# Patient Record
Sex: Male | Born: 1955
Health system: Southern US, Community
[De-identification: ages and names within clinical notes are randomized; demographics above are authoritative.]

## PROBLEM LIST (undated history)

## (undated) DIAGNOSIS — Z973 Presence of spectacles and contact lenses: Secondary | ICD-10-CM

## (undated) DIAGNOSIS — Z86010 Personal history of colonic polyps: Secondary | ICD-10-CM

## (undated) DIAGNOSIS — Z85038 Personal history of other malignant neoplasm of large intestine: Secondary | ICD-10-CM

## (undated) DIAGNOSIS — Z800 Family history of malignant neoplasm of digestive organs: Secondary | ICD-10-CM

## (undated) DIAGNOSIS — Z1509 Genetic susceptibility to other malignant neoplasm: Secondary | ICD-10-CM

## (undated) DIAGNOSIS — C189 Malignant neoplasm of colon, unspecified: Secondary | ICD-10-CM

## (undated) DIAGNOSIS — M1990 Unspecified osteoarthritis, unspecified site: Secondary | ICD-10-CM

## (undated) DIAGNOSIS — Z8041 Family history of malignant neoplasm of ovary: Secondary | ICD-10-CM

## (undated) DIAGNOSIS — R413 Other amnesia: Secondary | ICD-10-CM

## (undated) HISTORY — DX: Other amnesia: R41.3

## (undated) HISTORY — PX: COLONOSCOPY: SHX174

## (undated) HISTORY — DX: Personal history of other malignant neoplasm of large intestine: Z85.038

## (undated) HISTORY — PX: APPENDECTOMY: SHX54

## (undated) HISTORY — DX: Family history of malignant neoplasm of digestive organs: Z80.0

## (undated) HISTORY — DX: Malignant neoplasm of colon, unspecified: C18.9

## (undated) HISTORY — DX: Personal history of colonic polyps: Z86.010

## (undated) HISTORY — PX: TONSILLECTOMY: SUR1361

## (undated) HISTORY — DX: Genetic susceptibility to other malignant neoplasm: Z15.09

## (undated) HISTORY — PX: INGUINAL HERNIA REPAIR: SHX194

## (undated) HISTORY — DX: Family history of malignant neoplasm of ovary: Z80.41

---

## 1976-05-14 HISTORY — PX: WRIST GANGLION EXCISION: SUR520

## 1983-05-15 HISTORY — PX: COLECTOMY: SHX59

## 1998-01-06 ENCOUNTER — Encounter: Admission: RE | Admit: 1998-01-06 | Discharge: 1998-01-06 | Payer: Self-pay | Admitting: Family Medicine

## 1998-01-13 ENCOUNTER — Encounter: Admission: RE | Admit: 1998-01-13 | Discharge: 1998-01-13 | Payer: Self-pay | Admitting: Family Medicine

## 2003-12-01 ENCOUNTER — Emergency Department (HOSPITAL_COMMUNITY): Admission: EM | Admit: 2003-12-01 | Discharge: 2003-12-01 | Payer: Self-pay | Admitting: Emergency Medicine

## 2005-12-20 ENCOUNTER — Encounter: Admission: RE | Admit: 2005-12-20 | Discharge: 2006-01-11 | Payer: Self-pay | Admitting: Internal Medicine

## 2009-05-14 HISTORY — PX: SHOULDER ARTHROSCOPY: SHX128

## 2010-02-09 ENCOUNTER — Ambulatory Visit (HOSPITAL_BASED_OUTPATIENT_CLINIC_OR_DEPARTMENT_OTHER): Admission: RE | Admit: 2010-02-09 | Discharge: 2010-02-09 | Payer: Self-pay | Admitting: Orthopedic Surgery

## 2012-08-19 ENCOUNTER — Other Ambulatory Visit: Payer: Self-pay | Admitting: Orthopedic Surgery

## 2012-08-20 ENCOUNTER — Encounter (HOSPITAL_BASED_OUTPATIENT_CLINIC_OR_DEPARTMENT_OTHER): Payer: Self-pay | Admitting: *Deleted

## 2012-08-20 NOTE — Progress Notes (Signed)
Pt here for rt shoulder 2011-did well

## 2012-08-20 NOTE — H&P (Signed)
  Jose Fisher is an 56 y.o. male.   Chief Complaint: c/o chronic and progressive left shoulder pain HPI:     Jose Fisher is a well known former patient who presents for preoperative counseling anticipating reconstruction of his left shoulder rotator cuff tear.  During my absence, Jose Fisher saw Dr. Mina Marble for evaluation of left shoulder pain and had a MRI documenting a retracted (3.6 cm.) supraspinatus rotator cuff tear as well as tendinopathy of the infraspinatus and unfavorable AC and acromial anatomy.  Jose Fisher has had an outstanding result following reconstruction of his right rotator cuff on 02/09/2010.  He now presents requesting similar surgery on the left side.     Past Medical History  Diagnosis Date  . Arthritis   . Wears glasses     Past Surgical History  Procedure Laterality Date  . Tonsillectomy    . Appendectomy    . Colectomy  1985    hemi  . Inguinal hernia repair      bilat  . Wrist ganglion excision  1978    lt  . Colonoscopy    . Shoulder arthroscopy  2011    RCR-DSC-right stayed RCC    History reviewed. No pertinent family history. Social History:  reports that he has never smoked. He does not have any smokeless tobacco history on file. He reports that  drinks alcohol. He reports that he does not use illicit drugs.  Allergies: No Known Allergies  No prescriptions prior to admission    No results found for this or any previous visit (from the past 48 hour(s)).  No results found.   Pertinent items are noted in HPI.  Height 5\' 4"  (1.626 m), weight 72.122 kg (159 lb).  General appearance: alert Head: Normocephalic, without obvious abnormality Neck: supple, symmetrical, trachea midline Resp: clear to auscultation bilaterally Cardio: regular rate and rhythm GI: normal findings: bowel sounds normal Extremities:  His gait and stance are unremarkable. He has full range of motion of his right shoulder, the left shoulder has near full range of  motion, but has pain at the limits of elevation and internal rotation behind his back.  He has weakness of abduction, external rotation.  He has intact pulse and capillary refill.     RADIOGRAPHS:   We reviewed his MRI which demonstrates unfavorable AC and acromial anatomy as well as the aforementioned retracted rotator cuff tear.  We will carefully inspect his subscapularis.   Pulses: 2+ and symmetric Skin: normal Neurologic: Grossly normal    Assessment/Plan Impression::   Left shoulder rotator cuff tear.  Plan:: To the OR for left SA with SAD/DCR with [facility] repair   The surgery, potential risks and benefits; aftercare and his work restrictions were reviewed in detail.  He will be treated on an outpatient basis with a 23 hour observation.  We discussed his perioperative pain management in detail.    DASNOIT,Diara Chaudhari J 08/20/2012, 9:05 PM   H&P documentation: 08/21/2012  -History and Physical Reviewed  -Patient has been re-examined  -No change in the plan of care  Wyn Forster, MD

## 2012-08-21 ENCOUNTER — Encounter (HOSPITAL_BASED_OUTPATIENT_CLINIC_OR_DEPARTMENT_OTHER)
Admission: RE | Disposition: A | Discharge status: Home / Self Care | Payer: Self-pay | Source: Ambulatory Visit | Attending: Orthopedic Surgery

## 2012-08-21 ENCOUNTER — Ambulatory Visit (HOSPITAL_BASED_OUTPATIENT_CLINIC_OR_DEPARTMENT_OTHER): Admitting: Certified Registered"

## 2012-08-21 ENCOUNTER — Ambulatory Visit (HOSPITAL_BASED_OUTPATIENT_CLINIC_OR_DEPARTMENT_OTHER)
Admission: RE | Admit: 2012-08-21 | Discharge: 2012-08-22 | Disposition: A | Discharge status: Home / Self Care | Source: Ambulatory Visit | Attending: Orthopedic Surgery | Admitting: Orthopedic Surgery

## 2012-08-21 ENCOUNTER — Encounter (HOSPITAL_BASED_OUTPATIENT_CLINIC_OR_DEPARTMENT_OTHER): Payer: Self-pay | Admitting: Orthopedic Surgery

## 2012-08-21 ENCOUNTER — Encounter (HOSPITAL_BASED_OUTPATIENT_CLINIC_OR_DEPARTMENT_OTHER): Payer: Self-pay | Admitting: Certified Registered"

## 2012-08-21 DIAGNOSIS — M75110 Incomplete rotator cuff tear or rupture of unspecified shoulder, not specified as traumatic: Secondary | ICD-10-CM | POA: Insufficient documentation

## 2012-08-21 DIAGNOSIS — M19019 Primary osteoarthritis, unspecified shoulder: Secondary | ICD-10-CM | POA: Insufficient documentation

## 2012-08-21 DIAGNOSIS — M9420 Chondromalacia, unspecified site: Secondary | ICD-10-CM | POA: Insufficient documentation

## 2012-08-21 HISTORY — DX: Presence of spectacles and contact lenses: Z97.3

## 2012-08-21 HISTORY — DX: Unspecified osteoarthritis, unspecified site: M19.90

## 2012-08-21 HISTORY — PX: SHOULDER ARTHROSCOPY WITH ROTATOR CUFF REPAIR AND SUBACROMIAL DECOMPRESSION: SHX5686

## 2012-08-21 SURGERY — SHOULDER ARTHROSCOPY WITH ROTATOR CUFF REPAIR AND SUBACROMIAL DECOMPRESSION
Anesthesia: Regional | Site: Shoulder | Laterality: Left | Wound class: Clean

## 2012-08-21 MED ORDER — HYDROMORPHONE HCL PF 1 MG/ML IJ SOLN: 0.2500 mg | INTRAMUSCULAR | Status: DC | PRN

## 2012-08-21 MED ORDER — LIDOCAINE HCL (CARDIAC) 10 MG/ML IV SOLN
INTRAVENOUS | Status: DC | PRN
  Administered 2012-08-21: 80 mg via INTRAVENOUS

## 2012-08-21 MED ORDER — ONDANSETRON HCL 4 MG/2ML IJ SOLN: 4.0000 mg | Freq: Four times a day (QID) | INTRAMUSCULAR | Status: DC | PRN

## 2012-08-21 MED ORDER — METOCLOPRAMIDE HCL 5 MG PO TABS: 5.0000 mg | ORAL_TABLET | Freq: Three times a day (TID) | ORAL | Status: DC | PRN

## 2012-08-21 MED ORDER — OXYCODONE HCL 5 MG PO TABS: 5.0000 mg | ORAL_TABLET | Freq: Once | ORAL | Status: DC | PRN

## 2012-08-21 MED ORDER — CEFAZOLIN SODIUM-DEXTROSE 2-3 GM-% IV SOLR: 2.0000 g | INTRAVENOUS | Status: DC

## 2012-08-21 MED ORDER — HYDROMORPHONE HCL PF 1 MG/ML IJ SOLN
0.5000 mg | INTRAMUSCULAR | Status: DC | PRN
  Administered 2012-08-22: 1 mg via INTRAVENOUS

## 2012-08-21 MED ORDER — LACTATED RINGERS IV SOLN
INTRAVENOUS | Status: DC
  Administered 2012-08-21 (×2): via INTRAVENOUS

## 2012-08-21 MED ORDER — CHLORHEXIDINE GLUCONATE 4 % EX LIQD
60.0000 mL | Freq: Once | CUTANEOUS | Status: AC
  Administered 2012-08-21: 4 via TOPICAL

## 2012-08-21 MED ORDER — ONDANSETRON HCL 4 MG PO TABS: 4.0000 mg | ORAL_TABLET | Freq: Four times a day (QID) | ORAL | Status: DC | PRN

## 2012-08-21 MED ORDER — PROMETHAZINE HCL 25 MG/ML IJ SOLN: 6.2500 mg | INTRAMUSCULAR | Status: DC | PRN

## 2012-08-21 MED ORDER — EPHEDRINE SULFATE 50 MG/ML IJ SOLN
INTRAMUSCULAR | Status: DC | PRN
  Administered 2012-08-21: 10 mg via INTRAVENOUS

## 2012-08-21 MED ORDER — ROPIVACAINE HCL 5 MG/ML IJ SOLN
INTRAMUSCULAR | Status: DC | PRN
  Administered 2012-08-21: 25 mL

## 2012-08-21 MED ORDER — SODIUM CHLORIDE 0.9 % IR SOLN
Status: DC | PRN
  Administered 2012-08-21: 3000 mL

## 2012-08-21 MED ORDER — METHOCARBAMOL 100 MG/ML IJ SOLN: 500.0000 mg | Freq: Four times a day (QID) | INTRAVENOUS | Status: DC | PRN

## 2012-08-21 MED ORDER — METOCLOPRAMIDE HCL 5 MG/ML IJ SOLN: 5.0000 mg | Freq: Three times a day (TID) | INTRAMUSCULAR | Status: DC | PRN

## 2012-08-21 MED ORDER — DEXAMETHASONE SODIUM PHOSPHATE 4 MG/ML IJ SOLN
INTRAMUSCULAR | Status: DC | PRN
  Administered 2012-08-21: 4 mg via INTRAVENOUS

## 2012-08-21 MED ORDER — METHOCARBAMOL 500 MG PO TABS
500.0000 mg | ORAL_TABLET | Freq: Four times a day (QID) | ORAL | Status: DC | PRN
  Administered 2012-08-21 – 2012-08-22 (×3): 500 mg via ORAL

## 2012-08-21 MED ORDER — PROPOFOL 10 MG/ML IV BOLUS
INTRAVENOUS | Status: DC | PRN
  Administered 2012-08-21: 180 mg via INTRAVENOUS

## 2012-08-21 MED ORDER — CEFAZOLIN SODIUM 1-5 GM-% IV SOLN
1.0000 g | Freq: Four times a day (QID) | INTRAVENOUS | Status: AC
  Administered 2012-08-21 – 2012-08-22 (×2): 1 g via INTRAVENOUS

## 2012-08-21 MED ORDER — GLYCOPYRROLATE 0.2 MG/ML IJ SOLN
INTRAMUSCULAR | Status: DC | PRN
  Administered 2012-08-21: 0.2 mg via INTRAVENOUS

## 2012-08-21 MED ORDER — MEPERIDINE HCL 25 MG/ML IJ SOLN: 6.2500 mg | INTRAMUSCULAR | Status: DC | PRN

## 2012-08-21 MED ORDER — HYDROMORPHONE HCL 2 MG PO TABS
2.0000 mg | ORAL_TABLET | ORAL | Status: DC | PRN
  Administered 2012-08-22 (×2): 2 mg via ORAL

## 2012-08-21 MED ORDER — CEFAZOLIN SODIUM-DEXTROSE 2-3 GM-% IV SOLR
2.0000 g | Freq: Once | INTRAVENOUS | Status: AC
  Administered 2012-08-21: 2 g via INTRAVENOUS

## 2012-08-21 MED ORDER — CEFAZOLIN SODIUM 1-5 GM-% IV SOLN: 1.0000 g | Freq: Four times a day (QID) | INTRAVENOUS | Status: DC

## 2012-08-21 MED ORDER — IBUPROFEN 600 MG PO TABS: 600.0000 mg | ORAL_TABLET | Freq: Four times a day (QID) | ORAL | Status: DC | PRN

## 2012-08-21 MED ORDER — FENTANYL CITRATE 0.05 MG/ML IJ SOLN
50.0000 ug | INTRAMUSCULAR | Status: DC | PRN
  Administered 2012-08-21: 50 ug via INTRAVENOUS

## 2012-08-21 MED ORDER — CEPHALEXIN 500 MG PO CAPS: 500.0000 mg | ORAL_CAPSULE | Freq: Three times a day (TID) | ORAL | Status: DC

## 2012-08-21 MED ORDER — ROPIVACAINE HCL 5 MG/ML IJ SOLN
INTRAMUSCULAR | Status: DC | PRN
  Administered 2012-08-21: 3 mL

## 2012-08-21 MED ORDER — HYDROMORPHONE HCL 2 MG PO TABS: 2.0000 mg | ORAL_TABLET | ORAL | Status: DC | PRN

## 2012-08-21 MED ORDER — DEXTROSE-NACL 5-0.45 % IV SOLN
INTRAVENOUS | Status: DC
  Administered 2012-08-21: 17:00:00 via INTRAVENOUS

## 2012-08-21 MED ORDER — OXYCODONE HCL 5 MG/5ML PO SOLN: 5.0000 mg | Freq: Once | ORAL | Status: DC | PRN

## 2012-08-21 MED ORDER — MIDAZOLAM HCL 2 MG/2ML IJ SOLN
1.0000 mg | INTRAMUSCULAR | Status: DC | PRN
  Administered 2012-08-21: 1 mg via INTRAVENOUS

## 2012-08-21 SURGICAL SUPPLY — 83 items
ANCH SUT PUSHLCK 24X4.5 STRL (Orthopedic Implant) ×1 IMPLANT
ANCH SUT SWLK 19.1 CLS EYLT TL (Anchor) ×1 IMPLANT
ANCH SUT SWLK 19.1X4.75 (Anchor) ×2 IMPLANT
ANCHOR SUT BIO SW 4.75 W/FIB (Anchor) ×1 IMPLANT
ANCHOR SUT BIO SW 4.75X19.1 (Anchor) ×2 IMPLANT
BANDAGE ADHESIVE 1X3 (GAUZE/BANDAGES/DRESSINGS) IMPLANT
BLADE AVERAGE 25X9 (BLADE) IMPLANT
BLADE CUTTER MENIS 5.5 (BLADE) ×1 IMPLANT
BLADE SURG 15 STRL LF DISP TIS (BLADE) ×2 IMPLANT
BLADE SURG 15 STRL SS (BLADE) ×4
BUR EGG 3PK/BX (BURR) ×1 IMPLANT
BUR OVAL 6.0 (BURR) ×2 IMPLANT
CANISTER OMNI JUG 16 LITER (MISCELLANEOUS) ×2 IMPLANT
CANISTER SUCTION 2500CC (MISCELLANEOUS) IMPLANT
CANNULA TWIST IN 8.25X7CM (CANNULA) IMPLANT
CLEANER CAUTERY TIP 5X5 PAD (MISCELLANEOUS) IMPLANT
CLOTH BEACON ORANGE TIMEOUT ST (SAFETY) ×2 IMPLANT
CUTTER MENISCUS  4.2MM (BLADE) ×1
CUTTER MENISCUS 4.2MM (BLADE) ×1 IMPLANT
DECANTER SPIKE VIAL GLASS SM (MISCELLANEOUS) IMPLANT
DRAPE INCISE IOBAN 66X45 STRL (DRAPES) ×2 IMPLANT
DRAPE STERI 35X30 U-POUCH (DRAPES) ×2 IMPLANT
DRAPE SURG 17X23 STRL (DRAPES) ×2 IMPLANT
DRAPE U-SHAPE 47X51 STRL (DRAPES) ×2 IMPLANT
DRAPE U-SHAPE 76X120 STRL (DRAPES) ×4 IMPLANT
DRSG PAD ABDOMINAL 8X10 ST (GAUZE/BANDAGES/DRESSINGS) ×2 IMPLANT
DURAPREP 26ML APPLICATOR (WOUND CARE) ×2 IMPLANT
ELECT REM PT RETURN 9FT ADLT (ELECTROSURGICAL) ×2
ELECTRODE REM PT RTRN 9FT ADLT (ELECTROSURGICAL) IMPLANT
GLOVE BIO SURGEON STRL SZ 6.5 (GLOVE) ×1 IMPLANT
GLOVE BIOGEL M STRL SZ7.5 (GLOVE) ×2 IMPLANT
GLOVE BIOGEL PI IND STRL 7.0 (GLOVE) IMPLANT
GLOVE BIOGEL PI IND STRL 8 (GLOVE) ×2 IMPLANT
GLOVE BIOGEL PI INDICATOR 7.0 (GLOVE) ×1
GLOVE BIOGEL PI INDICATOR 8 (GLOVE) ×2
GLOVE ORTHO TXT STRL SZ7.5 (GLOVE) ×2 IMPLANT
GOWN BRE IMP PREV XXLGXLNG (GOWN DISPOSABLE) ×4 IMPLANT
GOWN STRL REIN 2XL XLG LVL4 (GOWN DISPOSABLE) ×2 IMPLANT
NDL SCORPION (NEEDLE) ×1 IMPLANT
NDL SUT 6 .5 CRC .975X.05 MAYO (NEEDLE) IMPLANT
NEEDLE MAYO TAPER (NEEDLE) ×2
NEEDLE MINI RC 24MM (NEEDLE) IMPLANT
NEEDLE SCORPION (NEEDLE) ×2 IMPLANT
PACK ARTHROSCOPY DSU (CUSTOM PROCEDURE TRAY) ×2 IMPLANT
PACK BASIN DAY SURGERY FS (CUSTOM PROCEDURE TRAY) ×2 IMPLANT
PAD CLEANER CAUTERY TIP 5X5 (MISCELLANEOUS)
PASSER SUT SWANSON 36MM LOOP (INSTRUMENTS) IMPLANT
PENCIL BUTTON HOLSTER BLD 10FT (ELECTRODE) ×1 IMPLANT
PUSHLOCK PEEK 4.5X24 (Orthopedic Implant) ×1 IMPLANT
SLEEVE SCD COMPRESS KNEE MED (MISCELLANEOUS) ×2 IMPLANT
SLING ARM FOAM STRAP LRG (SOFTGOODS) IMPLANT
SLING ARM FOAM STRAP MED (SOFTGOODS) ×1 IMPLANT
SPONGE GAUZE 4X4 12PLY (GAUZE/BANDAGES/DRESSINGS) ×2 IMPLANT
SPONGE LAP 4X18 X RAY DECT (DISPOSABLE) ×2 IMPLANT
STRIP CLOSURE SKIN 1/2X4 (GAUZE/BANDAGES/DRESSINGS) ×1 IMPLANT
SUCTION FRAZIER TIP 10 FR DISP (SUCTIONS) IMPLANT
SUT ETHIBOND 2 OS 4 DA (SUTURE) IMPLANT
SUT ETHILON 4 0 PS 2 18 (SUTURE) IMPLANT
SUT FIBERWIRE #2 38 T-5 BLUE (SUTURE) ×2
SUT FIBERWIRE 3-0 18 TAPR NDL (SUTURE)
SUT PROLENE 1 CT (SUTURE) IMPLANT
SUT PROLENE 3 0 PS 2 (SUTURE) ×2 IMPLANT
SUT TIGER TAPE 7 IN WHITE (SUTURE) ×1 IMPLANT
SUT VIC AB 0 CT1 27 (SUTURE) ×2
SUT VIC AB 0 CT1 27XBRD ANBCTR (SUTURE) IMPLANT
SUT VIC AB 0 SH 27 (SUTURE) IMPLANT
SUT VIC AB 2-0 SH 27 (SUTURE) ×2
SUT VIC AB 2-0 SH 27XBRD (SUTURE) IMPLANT
SUT VIC AB 3-0 SH 27 (SUTURE)
SUT VIC AB 3-0 SH 27X BRD (SUTURE) IMPLANT
SUT VIC AB 3-0 X1 27 (SUTURE) IMPLANT
SUTURE FIBERWR #2 38 T-5 BLUE (SUTURE) IMPLANT
SUTURE FIBERWR 3-0 18 TAPR NDL (SUTURE) IMPLANT
SYR 3ML 23GX1 SAFETY (SYRINGE) IMPLANT
SYR BULB 3OZ (MISCELLANEOUS) ×1 IMPLANT
TAPE FIBER 2MM 7IN #2 BLUE (SUTURE) ×1 IMPLANT
TAPE PAPER 3X10 WHT MICROPORE (GAUZE/BANDAGES/DRESSINGS) ×2 IMPLANT
TOWEL OR 17X24 6PK STRL BLUE (TOWEL DISPOSABLE) ×2 IMPLANT
TUBE CONNECTING 20X1/4 (TUBING) ×2 IMPLANT
TUBING ARTHROSCOPY IRRIG 16FT (MISCELLANEOUS) ×2 IMPLANT
WAND STAR VAC 90 (SURGICAL WAND) ×2 IMPLANT
WATER STERILE IRR 1000ML POUR (IV SOLUTION) ×2 IMPLANT
YANKAUER SUCT BULB TIP NO VENT (SUCTIONS) IMPLANT

## 2012-08-21 NOTE — Anesthesia Preprocedure Evaluation (Signed)
Anesthesia Evaluation  Patient identified by MRN, date of birth, ID band Patient awake    Reviewed: Allergy & Precautions, H&P , NPO status , Patient's Chart, lab work & pertinent test results  History of Anesthesia Complications Negative for: history of anesthetic complications  Airway Mallampati: I  Neck ROM: full    Dental no notable dental hx. (+) Teeth Intact   Pulmonary neg pulmonary ROS,  breath sounds clear to auscultation  Pulmonary exam normal       Cardiovascular negative cardio ROS  IRhythm:regular Rate:Normal     Neuro/Psych negative neurological ROS  negative psych ROS   GI/Hepatic negative GI ROS, Neg liver ROS,   Endo/Other  negative endocrine ROS  Renal/GU negative Renal ROS  negative genitourinary   Musculoskeletal   Abdominal   Peds  Hematology negative hematology ROS (+)   Anesthesia Other Findings   Reproductive/Obstetrics negative OB ROS                           Anesthesia Physical Anesthesia Plan  ASA: I  Anesthesia Plan: General and General ETT   Post-op Pain Management: MAC Combined w/ Regional for Post-op pain   Induction:   Airway Management Planned:   Additional Equipment:   Intra-op Plan:   Post-operative Plan:   Informed Consent: I have reviewed the patients History and Physical, chart, labs and discussed the procedure including the risks, benefits and alternatives for the proposed anesthesia with the patient or authorized representative who has indicated his/her understanding and acceptance.     Plan Discussed with: CRNA and Surgeon  Anesthesia Plan Comments:         Anesthesia Quick Evaluation

## 2012-08-21 NOTE — Transfer of Care (Signed)
Immediate Anesthesia Transfer of Care Note  Patient: Jose Fisher  Procedure(s) Performed: Procedure(s): LEFT SHOULDER ARTHROSCOPY SUBACROMIAL DECOMPRESSION, DISTAL CLAVICLE RESECTION AND REPAIR ROTATOR CUFF (Left)  Patient Location: PACU  Anesthesia Type:GA combined with regional for post-op pain  Level of Consciousness: awake, sedated and patient cooperative  Airway & Oxygen Therapy: Patient Spontanous Breathing and Patient connected to face mask oxygen  Post-op Assessment: Report given to PACU RN and Post -op Vital signs reviewed and stable  Post vital signs: Reviewed and stable  Complications: No apparent anesthesia complications

## 2012-08-21 NOTE — Anesthesia Procedure Notes (Addendum)
Anesthesia Regional Block:  Interscalene brachial plexus block  Pre-Anesthetic Checklist: ,, timeout performed, Correct Patient, Correct Site, Correct Laterality, Correct Procedure, Correct Position, site marked, Risks and benefits discussed, pre-op evaluation,  At surgeon's request and post-op pain management  Laterality: Upper  Prep: chloraprep       Needles:  Injection technique: Single-shot  Needle Type: Echogenic Needle      Needle Gauge: 22 and 22 G  Needle insertion depth: 3 cm   Additional Needles:  Procedures: ultrasound guided (picture in chart) and nerve stimulator Interscalene brachial plexus block  Nerve Stimulator or Paresthesia:  Response: Twitch elicited, 0.5 mA, 0.3 ms,   Additional Responses:   Narrative:  Start time: 08/21/2012 11:45 AM End time: 08/21/2012 12:03 PM Injection made incrementally with aspirations every 5 mL.  Performed by: Personally  Anesthesiologist: Alma Friendly, MD  Additional Notes: Block assessed prior to start of surgery  Interscalene brachial plexus block Procedure Name: Intubation Date/Time: 08/21/2012 1:43 PM Performed by: Gar Gibbon Pre-anesthesia Checklist: Patient identified, Emergency Drugs available, Suction available and Patient being monitored Patient Re-evaluated:Patient Re-evaluated prior to inductionOxygen Delivery Method: Circle System Utilized Preoxygenation: Pre-oxygenation with 100% oxygen Intubation Type: IV induction Ventilation: Mask ventilation without difficulty Laryngoscope Size: Miller and 3 Grade View: Grade II Tube type: Oral Tube size: 8.0 mm Number of attempts: 1 Airway Equipment and Method: stylet and oral airway Placement Confirmation: ETT inserted through vocal cords under direct vision,  positive ETCO2 and breath sounds checked- equal and bilateral Secured at: 22 cm Tube secured with: Tape Dental Injury: Teeth and Oropharynx as per pre-operative assessment

## 2012-08-21 NOTE — Progress Notes (Signed)
Assisted Dr. Massagee with left, ultrasound guided, interscalene  block. Side rails up, monitors on throughout procedure. See vital signs in flow sheet. Tolerated Procedure well. 

## 2012-08-21 NOTE — Brief Op Note (Signed)
08/21/2012  3:41 PM  PATIENT:  Jose Fisher  57 y.o. male  PRE-OPERATIVE DIAGNOSIS:  LEFT SHOULDER ROTATOR CUFF TEAR A-C DJD IMPINGEMENT  POST-OPERATIVE DIAGNOSIS:  Left Shoulder rotator cuff Tear  PROCEDURE:  Procedure(s): LEFT SHOULDER ARTHROSCOPY SUBACROMIAL DECOMPRESSION, DISTAL CLAVICLE RESECTION AND REPAIR ROTATOR CUFF (Left)  SURGEON:  Surgeon(s) and Role:    * Wyn Forster., MD - Primary  PHYSICIAN ASSISTANT:   ASSISTANTS:Saman Giddens Dasnoit,P.A-C   ANESTHESIA:   general  EBL:  Total I/O In: 1000 [I.V.:1000] Out: -   BLOOD ADMINISTERED:none  DRAINS: none   LOCAL MEDICATIONS USED: Ropivacaine suprascapular nerve block  SPECIMEN:  No Specimen  DISPOSITION OF SPECIMEN:  N/A  COUNTS:  YES  TOURNIQUET:  * No tourniquets in log *  DICTATION: .Other Dictation: Dictation Number [id number]  PLAN OF CARE: Admit for overnight observation  PATIENT DISPOSITION:  PACU - hemodynamically stable.   Delay start of Pharmacological VTE agent (>24hrs) due to surgical blood loss or risk of bleeding: not applicable

## 2012-08-21 NOTE — Anesthesia Postprocedure Evaluation (Signed)
  Anesthesia Post-op Note  Patient: Jose Fisher  Procedure(s) Performed: Procedure(s): LEFT SHOULDER ARTHROSCOPY SUBACROMIAL DECOMPRESSION, DISTAL CLAVICLE RESECTION AND REPAIR ROTATOR CUFF (Left)  Patient Location: PACU  Anesthesia Type:General and GA combined with regional for post-op pain  Level of Consciousness: awake, alert  and oriented  Airway and Oxygen Therapy: Patient Spontanous Breathing  Post-op Pain: none  Post-op Assessment: Post-op Vital signs reviewed, Patient's Cardiovascular Status Stable, Respiratory Function Stable, Patent Airway and Pain level controlled  Post-op Vital Signs: stable  Complications: No apparent anesthesia complications

## 2012-08-21 NOTE — Op Note (Signed)
[id number] 

## 2012-08-22 ENCOUNTER — Encounter (HOSPITAL_BASED_OUTPATIENT_CLINIC_OR_DEPARTMENT_OTHER): Payer: Self-pay | Admitting: Orthopedic Surgery

## 2012-08-22 NOTE — Op Note (Signed)
NAME:  Jose Fisher, LASHLEY.:  [id number]  MEDICAL RECORD NO.:  [id number]  LOCATION:                                 FACILITY:  PHYSICIAN:  Katy Fitch. Elaysia Devargas, M.D. DATE OF BIRTH:  Jul 20, 1955  DATE OF PROCEDURE:  08/21/2012 DATE OF DISCHARGE:                              OPERATIVE REPORT   PREOPERATIVE DIAGNOSES:  Severe pain and weakness left shoulder due to large chronic retracted supraspinatus rotator cuff tear with extensive infraspinatus tendinopathy and unfavorable acromioclavicular and acromial anatomy.  POSTOPERATIVE DIAGNOSES:  Chronic retracted supraspinatus rotator cuff tear with retraction of more than 4 cm and very poor quality infraspinatus tendon, acromioclavicular degenerative arthritis, and unfavorable acromial anatomy.  OPERATION: 1. Diagnostic arthroscopy, left glenohumeral joint. 2. Arthroscopic subacromial decompression with acromioplasty,     coracoacromial ligament release, and bursectomy. 3. Arthroscopic distal clavicle resection. 4. Open reconstruction of chronically retracted rotator cuff including     supraspinatus infraspinatus tendons with margin convergence and     lateralization using 2 lateral swivel locks and a push lock.  We     achieved a 90% anatomic repair to decorticate the greater     tuberosity.  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  His work root involves repetitive lifting.  In 2012, he presented with right shoulder pain.  He was found to have a large rotator cuff tear. Performed an extensive rotator cuff reconstruction ultimately achieving virtually anatomic repair and an excellent long-term result.  Jose Fisher had problems with left shoulder, but due to the challenging circumstances of rotator cuff rehabilitation, he waited until 2014, to pursue surgery.  He had a recent MRI which revealed a very large retracted  supraspinatus and infraspinatus rotator cuff tear.  We advised him to proceed with attempted reconstruction.  Given the chronic nature of this left side tear, he understands that this will be a best effort on our part to repair or partially reconstruct the rotator cuff.  He anticipates subacromial decompression, distal clavicle resection, joint debridement and intervention as our findings at the time of arthroscopy dictate.  Preoperatively, he was interviewed by Dr. Jacklynn Bue of Anesthesia, who recommended a ropivacaine supraclavicular block.  After informed consent, and questions being invited and answered with Dr. Jacklynn Bue, a ropivacaine supraclavicular block was placed without complication.  This led to excellent anesthesia of Jose Fisher.  PROCEDURE:  Jose Fisher was brought to room 1 of the [facility] and placed supine position on the operating table.  Following the induction of general anesthesia under Dr. Jacklynn Bue direct supervision, Jose Fisher was carefully positioned in the beach chair position with the aid of a torso and head holder designed for shoulder arthroscopy.  All bony prominences were padded and care was taken to make sure there were no tight straps or clothing across his waist or legs.  Passive compression devices were applied to his calf.  Examination of his shoulder under anesthesia revealed that his shoulder was fundamentally stable with respect to the capsular ligaments.  He had crepitation beneath the acromion consistent with a large  cuff tear.  The left arm was prepped with DuraPrep and draped with impervious arthroscopy drapes.  Following routine surgical time-out, the procedure commenced with placement of a scope through a standard posterior viewing portal with anterior switching stick technique.  Diagnostic arthroscopy revealed a very degenerative severely retracted rotator cuff tear extending from the rotator interval  anteriorly to the glenoid medially and posteriorly, there was a rim of infraspinatus tendon remaining that was of poor quality and essentially a bursal leader as defined by Barcat.  We examined the greater tuberosity and found a significant reactive osteophyte.  The subscapularis was sound.  The biceps origin was sound. Minor debridement of the labrum was accomplished, followed by thorough lavage of the joint.  Jose Fisher was noted to have early degenerative arthritis of the glenohumeral joint with grade 3 chondromalacia of the humeral head and grade 2 chondromalacia of the glenoid.  After debridement, we proceeded to open reconstruction of the cuff prior to subacromial decompression of the distal clavicle resection due to the fact that with swelling this reconstruction would the extremely difficulty.  The scope was removed and a 3 cm muscle-splitting incision was fashioned at the anterior acromion.  This allowed [place] to visualize the tear.  The tear extended from the posterior aspect of the lesser tuberosity with loss of more than 90% of the insertion of the infraspinatus and loss of the entire insertion of the supraspinatus.  The infraspinatus and remnants of the supraspinatus had retracted posteriorly and were very contracted.  We debrided the rotator interval.  We debrided the margin of the infraspinatus. We placed a 2 traction reverse mattress sutures with a scorpion of fiber tape allowing mobilization of the supraspinatus infraspinatus.  With great care after releasing bursal adhesions, we performed margin convergence of the supraspinatus to the rotator interval and subsequently advanced the infraspinatus and the posterior half of the supraspinatus to an anatomic footprint.  A posterior swivel lock was placed for medial footprint, followed by use of a mattress suture to inset the infraspinatus anatomically to the decorticated greater tuberosity, followed by placement of 2 fiber  tapes, 2 lateral swivel locks, restoring the infraspinatus anatomically at the posterior half of the supraspinatus anatomically and about 5 mm of gap at the anterior supraspinatus.  The rotator interval was closed with a baseball stitch of #2 FiberWire and the tails of the swivel lock #2 FiberWire were used with a push lock posteriorly to finish the repair.  A good low profile repair was achieved with virtually anatomic footprint.  The scope was placed in the glenohumeral joint and clot and debris removed.  Photographic documentation of the final repair was accomplished with digital camera.  We then placed the scope in subacromial space carefully so as to avoid injury to the cuff repair.  We performed extensive bursectomy and found a remarkable amount of fat in the bursal region.  Jose Fisher had a type 2 acromion that was leveled to a type 1 morphology.  The coracoacromial ligament was released.  The distal clavicle was very degenerative and prominent.  This was documented with digital camera, followed by use of a suction bur to resect the distal cm of clavicle.  The clavicle resection was technically quite challenging due to the oblique nature of his AC joint.  We ultimately achieved satisfactory resection. Hemostasis achieved, followed a debridement of the subacromial space. We then removed the arthroscopic equipment and repair of the portals with mattress suture of 3-0 Prolene.  The deltoid split  was repaired with interrupted suture of 0 Vicryl, followed by repair of the skin with subcutaneous 3-0 Vicryl and intradermal 3-0 Prolene.  Jose Fisher will be observed in the post anesthesia care for 2 hour or more, and will be admitted to recovery care for 23 hours postop.  We anticipate providing Ancef 1 g IV q.6 hours x3 doses.  Appropriate analgesics form p.o. and IV Dilaudid and ibuprofen 600 mg q.6 hours p.r.n. pain.  Between the suprascapular nerve block and Dr. Marlane Mingle plexus  block, he should have very satisfactory pain control for the initial 12 hours postop.     Katy Fitch Liliya Fullenwider, M.D.     RVS/MEDQ  D:  08/21/2012  T:  08/22/2012  Job:  [id number]

## 2013-07-09 ENCOUNTER — Ambulatory Visit: Payer: Self-pay | Admitting: Family Medicine

## 2013-07-17 ENCOUNTER — Encounter: Payer: Self-pay | Admitting: Family Medicine

## 2013-07-17 ENCOUNTER — Ambulatory Visit (INDEPENDENT_AMBULATORY_CARE_PROVIDER_SITE_OTHER): Admitting: Family Medicine

## 2013-07-17 VITALS — BP 126/82 | HR 68 | Temp 97.5°F | Resp 16 | Ht 63.0 in | Wt 170.0 lb

## 2013-07-17 DIAGNOSIS — Z13220 Encounter for screening for lipoid disorders: Secondary | ICD-10-CM

## 2013-07-17 DIAGNOSIS — Z125 Encounter for screening for malignant neoplasm of prostate: Secondary | ICD-10-CM

## 2013-07-17 NOTE — Progress Notes (Signed)
   Subjective:    Patient ID: Jose Fisher, male    DOB: 1955/09/24, 58 y.o.   MRN: [id number]  HPI  Patient is here today for a prostate exam.  He has no concerns. He is also requesting fasting lab work including a CMP, fasting lipid panel, and CBC. He denies any urinary frequency, hesitancy, dysuria, or nocturia. He is complaining of pain in his right knee and both joint lines. He also reports episodic effusions in the knee joint. He denies any injury to the knee joint. Previous x-ray revealed osteoarthritis. Otherwise he is doing well. Past Medical History  Diagnosis Date  . Arthritis   . Wears glasses    Current Outpatient Prescriptions on File Prior to Visit  Medication Sig Dispense Refill  . cholecalciferol (VITAMIN D) 1000 UNITS tablet Take 1,000 Units by mouth daily.      Marland Kitchen ibuprofen (ADVIL,MOTRIN) 600 MG tablet Take 1 tablet (600 mg total) by mouth every 6 (six) hours as needed for pain.  30 tablet  1  . meloxicam (MOBIC) 15 MG tablet Take 15 mg by mouth daily.      . Multiple Vitamins-Minerals (MULTIVITAMIN WITH MINERALS) tablet Take 1 tablet by mouth daily.      . vitamin C (ASCORBIC ACID) 500 MG tablet Take 500 mg by mouth daily.       No current facility-administered medications on file prior to visit.   No Known Allergies History   Social History  . Marital Status: Married    Spouse Name: N/A    Number of Children: N/A  . Years of Education: N/A   Occupational History  . Not on file.   Social History Main Topics  . Smoking status: Never Smoker   . Smokeless tobacco: Not on file  . Alcohol Use: Yes     Comment: occ  . Drug Use: No  . Sexual Activity: Not on file   Other Topics Concern  . Not on file   Social History Narrative  . No narrative on file     Review of Systems  All other systems reviewed and are negative.       Objective:   Physical Exam  Vitals reviewed. Cardiovascular: Normal rate, regular rhythm and normal heart sounds.     Pulmonary/Chest: Effort normal and breath sounds normal.  Genitourinary: Rectum normal and prostate normal.  Musculoskeletal:       Right knee: He exhibits normal range of motion, no swelling, no effusion, no LCL laxity, normal patellar mobility, normal meniscus and no MCL laxity. Tenderness found. Medial joint line and lateral joint line tenderness noted. No MCL and no LCL tenderness noted.          Assessment & Plan:  1. Screening cholesterol level I will check a CMP CBC and fasting lipid panel. The patient's goal LDL is less than 130 - COMPLETE METABOLIC PANEL WITH GFR - CBC with Differential - Lipid panel  2. Prostate cancer screening Patient's prostate exam was completely normal. Also check a PSA - PSA  I believe the pain in the patient's right knee is likely due to osteoarthritis. There is no evidence of a ligament tear or meniscal tear. I recommended ibuprofen as needed along with Osteo Bi-Flex.

## 2013-07-18 LAB — LIPID PANEL
Cholesterol: 167 mg/dL (ref 0–200)
HDL: 65 mg/dL (ref >39)
LDL CALC: 84 mg/dL (ref 0–99)
Total CHOL/HDL Ratio: 2.6 Ratio
Triglycerides: 88 mg/dL (ref <150)
VLDL: 18 mg/dL (ref 0–40)

## 2013-07-18 LAB — CBC WITH DIFFERENTIAL/PLATELET
BASOS ABS: 0.1 10*3/uL (ref 0.0–0.1)
BASOS PCT: 1 % (ref 0–1)
EOS ABS: 0.3 10*3/uL (ref 0.0–0.7)
EOS PCT: 4 % (ref 0–5)
HEMATOCRIT: 46.1 % (ref 39.0–52.0)
Hemoglobin: 15.9 g/dL (ref 13.0–17.0)
LYMPHS PCT: 37 % (ref 12–46)
Lymphs Abs: 2.7 10*3/uL (ref 0.7–4.0)
MCH: 29.9 pg (ref 26.0–34.0)
MCHC: 34.5 g/dL (ref 30.0–36.0)
MCV: 86.8 fL (ref 78.0–100.0)
MONO ABS: 0.4 10*3/uL (ref 0.1–1.0)
Monocytes Relative: 6 % (ref 3–12)
Neutro Abs: 3.7 10*3/uL (ref 1.7–7.7)
Neutrophils Relative %: 52 % (ref 43–77)
PLATELETS: 299 10*3/uL (ref 150–400)
RBC: 5.31 MIL/uL (ref 4.22–5.81)
RDW: 13.9 % (ref 11.5–15.5)
WBC: 7.2 10*3/uL (ref 4.0–10.5)

## 2013-07-18 LAB — COMPLETE METABOLIC PANEL WITH GFR
ALBUMIN: 4.3 g/dL (ref 3.5–5.2)
ALT: 24 U/L (ref 0–53)
AST: 27 U/L (ref 0–37)
Alkaline Phosphatase: 60 U/L (ref 39–117)
BUN: 9 mg/dL (ref 6–23)
CALCIUM: 9.4 mg/dL (ref 8.4–10.5)
CHLORIDE: 99 meq/L (ref 96–112)
CO2: 28 mEq/L (ref 19–32)
Creat: 0.79 mg/dL (ref 0.50–1.35)
GFR, Est African American: >89 mL/min
GLUCOSE: 78 mg/dL (ref 70–99)
POTASSIUM: 3.7 meq/L (ref 3.5–5.3)
SODIUM: 140 meq/L (ref 135–145)
TOTAL PROTEIN: 6.7 g/dL (ref 6.0–8.3)
Total Bilirubin: 0.8 mg/dL (ref 0.2–1.2)

## 2013-07-18 LAB — PSA: PSA: 0.87 ng/mL (ref <=4.00)

## 2013-07-21 ENCOUNTER — Encounter: Payer: Self-pay | Admitting: Family Medicine

## 2014-05-27 ENCOUNTER — Encounter: Payer: Self-pay | Admitting: Family Medicine

## 2014-05-27 ENCOUNTER — Ambulatory Visit (INDEPENDENT_AMBULATORY_CARE_PROVIDER_SITE_OTHER): Admitting: Family Medicine

## 2014-05-27 VITALS — BP 118/84 | HR 74 | Temp 98.2°F | Resp 16 | Ht 64.0 in | Wt 165.0 lb

## 2014-05-27 DIAGNOSIS — Z0000 Encounter for general adult medical examination without abnormal findings: Secondary | ICD-10-CM

## 2014-05-27 DIAGNOSIS — Z23 Encounter for immunization: Secondary | ICD-10-CM

## 2014-05-27 MED ORDER — ZOSTER VACCINE LIVE 19400 UNT/0.65ML ~~LOC~~ SOLR: 0.6500 mL | Freq: Once | SUBCUTANEOUS | Status: DC

## 2014-05-27 NOTE — Progress Notes (Signed)
Subjective:    Patient ID: Jose Fisher, male    DOB: 1955/07/07, 59 y.o.   MRN: [id number]  HPI Patient is here today for complete physical exam. Past medical history is significant for colon cancer at age 28 status post surgical resection. Patient receives a colonoscopy every 3 years at the [facility]. Otherwise his only concern has been some pain in his right knee due to arthritis. The remainder of his review of systems is negative. He is due today for a shingles vaccine, flu shot, tetanus shot. He prefers to defer the tetanus shot and the shingles vaccine to a later date. The remainder of his preventative care is up-to-date. He is due for fasting lab work Past Medical History  Diagnosis Date  . Arthritis   . Wears glasses    Past Surgical History  Procedure Laterality Date  . Tonsillectomy    . Appendectomy    . Colectomy  1985    hemi  . Inguinal hernia repair      bilat  . Wrist ganglion excision  1978    lt  . Colonoscopy    . Shoulder arthroscopy  2011    RCR-DSC-right stayed [facility]  . Shoulder arthroscopy with rotator cuff repair and subacromial decompression Left 08/21/2012    Procedure: LEFT SHOULDER ARTHROSCOPY SUBACROMIAL DECOMPRESSION, DISTAL CLAVICLE RESECTION AND REPAIR ROTATOR CUFF;  Surgeon: Cammie Sickle., MD;  Location: [facility];  Service: Orthopedics;  Laterality: Left;   Current Outpatient Prescriptions on File Prior to Visit  Medication Sig Dispense Refill  . aspirin 81 MG tablet Take 81 mg by mouth daily.    . cholecalciferol (VITAMIN D) 1000 UNITS tablet Take 1,000 Units by mouth daily.    . Garlic 564 MG TABS Take by mouth.    . Glucosamine-Chondroitin (OSTEO BI-FLEX REGULAR STRENGTH PO) Take by mouth.    Marland Kitchen ibuprofen (ADVIL,MOTRIN) 600 MG tablet Take 1 tablet (600 mg total) by mouth every 6 (six) hours as needed for pain. 30 tablet 1  . Multiple Vitamins-Minerals (MULTIVITAMIN WITH MINERALS) tablet Take 1 tablet by mouth daily.    . Omega-3  Fatty Acids (FISH OIL) 1000 MG CAPS Take by mouth.    . vitamin C (ASCORBIC ACID) 500 MG tablet Take 500 mg by mouth daily.    . meloxicam (MOBIC) 15 MG tablet Take 15 mg by mouth daily.     No current facility-administered medications on file prior to visit.   No Known Allergies History   Social History  . Marital Status: Married    Spouse Name: N/A    Number of Children: N/A  . Years of Education: N/A   Occupational History  . Not on file.   Social History Main Topics  . Smoking status: Never Smoker   . Smokeless tobacco: Not on file  . Alcohol Use: Yes     Comment: occ  . Drug Use: No  . Sexual Activity: Not on file   Other Topics Concern  . Not on file   Social History Narrative   No family history on file.    Review of Systems  All other systems reviewed and are negative.      Objective:   Physical Exam  Constitutional: He is oriented to person, place, and time. He appears well-developed and well-nourished. No distress.  HENT:  Head: Normocephalic and atraumatic.  Right Ear: External ear normal.  Left Ear: External ear normal.  Nose: Nose normal.  Mouth/Throat: Oropharynx is clear  and moist. No oropharyngeal exudate.  Eyes: Conjunctivae and EOM are normal. Pupils are equal, round, and reactive to light. Right eye exhibits no discharge. Left eye exhibits no discharge. No scleral icterus.  Neck: Normal range of motion. Neck supple. No JVD present. No tracheal deviation present. No thyromegaly present.  Cardiovascular: Normal rate, regular rhythm, normal heart sounds and intact distal pulses.  Exam reveals no gallop and no friction rub.   No murmur heard. Pulmonary/Chest: Effort normal and breath sounds normal. No stridor. No respiratory distress. He has no wheezes. He has no rales. He exhibits no tenderness.  Abdominal: Soft. Bowel sounds are normal. He exhibits no distension and no mass. There is no tenderness. There is no rebound and no guarding.    Genitourinary: Rectum normal, prostate normal and penis normal. No penile tenderness.  Musculoskeletal: Normal range of motion. He exhibits no edema or tenderness.  Lymphadenopathy:    He has no cervical adenopathy.  Neurological: He is alert and oriented to person, place, and time. He has normal reflexes. He displays normal reflexes. No cranial nerve deficit. He exhibits normal muscle tone. Coordination normal.  Skin: Skin is warm. No rash noted. He is not diaphoretic. No erythema. No pallor.  Psychiatric: He has a normal mood and affect. His behavior is normal. Judgment and thought content normal.  Vitals reviewed.         Assessment & Plan:  Routine general medical examination at a health care facility - Plan: COMPLETE METABOLIC PANEL WITH GFR, CBC with Differential, Lipid panel, PSA  Patient's physical exam is normal. He received his flu shot today. I will also check a CBC, CMP, fasting lipid panel, and PSA. The remainder of his exam is normal. His colonoscopy will be performed at the [place] [facility]. I recommended a tetanus vaccine as well as a shingles shot to this patient

## 2014-05-27 NOTE — Addendum Note (Signed)
Addended by: Shary Decamp B on: 05/27/2014 03:25 PM   Modules accepted: Orders

## 2014-05-28 LAB — CBC WITH DIFFERENTIAL/PLATELET
BASOS PCT: 1 % (ref 0–1)
Basophils Absolute: 0.1 10*3/uL (ref 0.0–0.1)
EOS PCT: 2 % (ref 0–5)
Eosinophils Absolute: 0.1 10*3/uL (ref 0.0–0.7)
HEMATOCRIT: 47.7 % (ref 39.0–52.0)
Hemoglobin: 16.5 g/dL (ref 13.0–17.0)
Lymphocytes Relative: 40 % (ref 12–46)
Lymphs Abs: 2.3 10*3/uL (ref 0.7–4.0)
MCH: 30.3 pg (ref 26.0–34.0)
MCHC: 34.6 g/dL (ref 30.0–36.0)
MCV: 87.7 fL (ref 78.0–100.0)
MONO ABS: 0.4 10*3/uL (ref 0.1–1.0)
MONOS PCT: 7 % (ref 3–12)
MPV: 11.4 fL (ref 8.6–12.4)
NEUTROS ABS: 2.9 10*3/uL (ref 1.7–7.7)
Neutrophils Relative %: 50 % (ref 43–77)
PLATELETS: 233 10*3/uL (ref 150–400)
RBC: 5.44 MIL/uL (ref 4.22–5.81)
RDW: 13.6 % (ref 11.5–15.5)
WBC: 5.8 10*3/uL (ref 4.0–10.5)

## 2014-05-28 LAB — COMPLETE METABOLIC PANEL WITH GFR
ALBUMIN: 4.3 g/dL (ref 3.5–5.2)
ALT: 25 U/L (ref 0–53)
AST: 28 U/L (ref 0–37)
Alkaline Phosphatase: 57 U/L (ref 39–117)
BILIRUBIN TOTAL: 0.9 mg/dL (ref 0.2–1.2)
BUN: 10 mg/dL (ref 6–23)
CHLORIDE: 102 meq/L (ref 96–112)
CO2: 26 meq/L (ref 19–32)
Calcium: 9.6 mg/dL (ref 8.4–10.5)
Creat: 0.86 mg/dL (ref 0.50–1.35)
GFR, Est Non African American: >89 mL/min
GLUCOSE: 82 mg/dL (ref 70–99)
Potassium: 4 mEq/L (ref 3.5–5.3)
SODIUM: 141 meq/L (ref 135–145)
TOTAL PROTEIN: 6.9 g/dL (ref 6.0–8.3)

## 2014-05-28 LAB — LIPID PANEL
CHOL/HDL RATIO: 2.5 ratio
Cholesterol: 204 mg/dL — ABNORMAL HIGH (ref 0–200)
HDL: 81 mg/dL (ref >39)
LDL CALC: 103 mg/dL — AB (ref 0–99)
Triglycerides: 100 mg/dL (ref <150)
VLDL: 20 mg/dL (ref 0–40)

## 2014-05-28 LAB — PSA: PSA: 1.3 ng/mL (ref <=4.00)

## 2014-06-01 ENCOUNTER — Encounter: Payer: Self-pay | Admitting: *Deleted

## 2014-09-27 ENCOUNTER — Ambulatory Visit (INDEPENDENT_AMBULATORY_CARE_PROVIDER_SITE_OTHER): Admitting: Physician Assistant

## 2014-09-27 ENCOUNTER — Encounter: Payer: Self-pay | Admitting: Physician Assistant

## 2014-09-27 VITALS — BP 130/70 | HR 64 | Temp 100.9°F | Resp 19 | Wt 159.0 lb

## 2014-09-27 DIAGNOSIS — R509 Fever, unspecified: Secondary | ICD-10-CM | POA: Diagnosis not present

## 2014-09-27 DIAGNOSIS — W57XXXA Bitten or stung by nonvenomous insect and other nonvenomous arthropods, initial encounter: Secondary | ICD-10-CM | POA: Diagnosis not present

## 2014-09-27 DIAGNOSIS — T148 Other injury of unspecified body region: Secondary | ICD-10-CM | POA: Diagnosis not present

## 2014-09-27 DIAGNOSIS — R110 Nausea: Secondary | ICD-10-CM | POA: Diagnosis not present

## 2014-09-27 DIAGNOSIS — R05 Cough: Secondary | ICD-10-CM | POA: Diagnosis not present

## 2014-09-27 DIAGNOSIS — R059 Cough, unspecified: Secondary | ICD-10-CM

## 2014-09-27 LAB — CBC WITH DIFFERENTIAL/PLATELET
BASOS ABS: 0 10*3/uL (ref 0.0–0.1)
Basophils Relative: 0 % (ref 0–1)
EOS PCT: 0 % (ref 0–5)
Eosinophils Absolute: 0 10*3/uL (ref 0.0–0.7)
HCT: 48.9 % (ref 39.0–52.0)
HEMOGLOBIN: 16.8 g/dL (ref 13.0–17.0)
LYMPHS ABS: 0.8 10*3/uL (ref 0.7–4.0)
LYMPHS PCT: 14 % (ref 12–46)
MCH: 30.5 pg (ref 26.0–34.0)
MCHC: 34.4 g/dL (ref 30.0–36.0)
MCV: 88.7 fL (ref 78.0–100.0)
MPV: 10.9 fL (ref 8.6–12.4)
Monocytes Absolute: 0.3 10*3/uL (ref 0.1–1.0)
Monocytes Relative: 6 % (ref 3–12)
NEUTROS ABS: 4.3 10*3/uL (ref 1.7–7.7)
Neutrophils Relative %: 80 % — ABNORMAL HIGH (ref 43–77)
PLATELETS: 173 10*3/uL (ref 150–400)
RBC: 5.51 MIL/uL (ref 4.22–5.81)
RDW: 13.9 % (ref 11.5–15.5)
WBC: 5.4 10*3/uL (ref 4.0–10.5)

## 2014-09-27 LAB — COMPLETE METABOLIC PANEL WITH GFR
ALBUMIN: 4.2 g/dL (ref 3.5–5.2)
ALK PHOS: 57 U/L (ref 39–117)
ALT: 20 U/L (ref 0–53)
AST: 21 U/L (ref 0–37)
BILIRUBIN TOTAL: 0.7 mg/dL (ref 0.2–1.2)
BUN: 8 mg/dL (ref 6–23)
CALCIUM: 9 mg/dL (ref 8.4–10.5)
CHLORIDE: 100 meq/L (ref 96–112)
CO2: 26 meq/L (ref 19–32)
Creat: 0.81 mg/dL (ref 0.50–1.35)
GFR, Est African American: >89 mL/min
GFR, Est Non African American: >89 mL/min
GLUCOSE: 103 mg/dL — AB (ref 70–99)
POTASSIUM: 4.7 meq/L (ref 3.5–5.3)
SODIUM: 140 meq/L (ref 135–145)
TOTAL PROTEIN: 6.8 g/dL (ref 6.0–8.3)

## 2014-09-27 MED ORDER — DOXYCYCLINE HYCLATE 100 MG PO TABS: 100.0000 mg | ORAL_TABLET | Freq: Two times a day (BID) | ORAL | Status: DC

## 2014-09-27 NOTE — Progress Notes (Signed)
Patient ID: Jose Fisher MRN: [id number], DOB: May 10, 1956, 59 y.o. Date of Encounter: @DATE @  Chief Complaint:  Chief Complaint  Patient presents with  . tick in the body  . Cough    1 week ago  . Abdominal Pain    sunday    HPI: 59 y.o. year old white male  presents with the following symptoms:  He states that he has had a cough for about one week. Is coughing up thick dark phlegm . His had no head or nasal congestion and no mucus from the nose. No significant sore throat and no ear ache.  He states that yesterday the lower half of his back had a dull ache across it.  He states that this morning he developed nausea. Has had no abdominal pain but just nausea. Says that he did not have this nausea yesterday and this was new this morning. Has had no actual vomiting and no diarrhea or other changes in bowel habits.  He found a tick on his left lower back about 3 weeks ago. He says that the tick had been on there for 2 days that he knows of. Originally, when he felt it, he thought it was just a recurrence of a bump that he had in that same location in the past. Says that he had been feeling that "bump" for 2 days prior to realizing that it was a tick and removing it.  He has seen no rash.  No other complaints or concerns.   Past Medical History  Diagnosis Date  . Arthritis   . Wears glasses      Home Meds: Outpatient Prescriptions Prior to Visit  Medication Sig Dispense Refill  . aspirin 81 MG tablet Take 81 mg by mouth daily.    . cholecalciferol (VITAMIN D) 1000 UNITS tablet Take 1,000 Units by mouth daily.    . Garlic 974 MG TABS Take by mouth.    Marland Kitchen ibuprofen (ADVIL,MOTRIN) 600 MG tablet Take 1 tablet (600 mg total) by mouth every 6 (six) hours as needed for pain. 30 tablet 1  . Multiple Vitamins-Minerals (MULTIVITAMIN WITH MINERALS) tablet Take 1 tablet by mouth daily.    . Omega-3 Fatty Acids (FISH OIL) 1000 MG CAPS Take by mouth.    . vitamin C (ASCORBIC ACID) 500  MG tablet Take 500 mg by mouth daily.    . Glucosamine-Chondroitin (OSTEO BI-FLEX REGULAR STRENGTH PO) Take by mouth.    . meloxicam (MOBIC) 15 MG tablet Take 15 mg by mouth daily.    Marland Kitchen zoster vaccine live, PF, (ZOSTAVAX) 16384 UNT/0.65ML injection Inject 19,400 Units into the skin once. (Patient not taking: Reported on 09/27/2014) 1 each 0   No facility-administered medications prior to visit.    Allergies: No Known Allergies  History   Social History  . Marital Status: Married    Spouse Name: N/A  . Number of Children: N/A  . Years of Education: N/A   Occupational History  . Not on file.   Social History Main Topics  . Smoking status: Never Smoker   . Smokeless tobacco: Not on file  . Alcohol Use: Yes     Comment: occ  . Drug Use: No  . Sexual Activity: Not on file   Other Topics Concern  . Not on file   Social History Narrative    History reviewed. No pertinent family history.   Review of Systems:  See HPI for pertinent ROS. All other ROS negative.  Physical Exam: Blood pressure 130/70, pulse 64, temperature 100.9 F (38.3 C), temperature source Oral, resp. rate 19, weight 159 lb (72.122 kg)., Body mass index is 27.28 kg/(m^2). General: WNWD WM. Appears in no acute distress. Head: Normocephalic, atraumatic, eyes without discharge, sclera non-icteric, nares are without discharge. Bilateral auditory canals clear, TM's are without perforation, pearly grey and translucent with reflective cone of light bilaterally. Oral cavity moist, posterior pharynx without exudate, erythema, peritonsillar abscess.  Neck: Supple. No thyromegaly. No lymphadenopathy. Lungs: Clear bilaterally to auscultation without wheezes, rales, or rhonchi. Breathing is unlabored. Heart: RRR with S1 S2. No murmurs, rubs, or gallops. Abdomen: Soft, non-tender, non-distended with normoactive bowel sounds. No hepatomegaly. No rebound/guarding. No obvious abdominal masses. Scar is present on abdomen from  prior surgery. Remainder of abdominal exam normal. Musculoskeletal:  Strength and tone normal for age. Extremities/Skin: Warm and dry. No rashes.  Neuro: Alert and oriented X 3. Moves all extremities spontaneously. Gait is normal. CNII-XII grossly in tact. Psych:  Responds to questions appropriately with a normal affect.     ASSESSMENT AND PLAN:  59 y.o. year old male with  1. Tick bite - CBC with Differential/Platelet - COMPLETE METABOLIC PANEL WITH GFR - B. burgdorfi antibodies by WB - Rocky mtn spotted fvr abs pnl(IgG+IgM) - doxycycline (VIBRA-TABS) 100 MG tablet; Take 1 tablet (100 mg total) by mouth 2 (two) times daily.  Dispense: 28 tablet; Refill: 0  2. Cough - doxycycline (VIBRA-TABS) 100 MG tablet; Take 1 tablet (100 mg total) by mouth 2 (two) times daily.  Dispense: 28 tablet; Refill: 0  3. Fever, unspecified fever cause - CBC with Differential/Platelet - COMPLETE METABOLIC PANEL WITH GFR - B. burgdorfi antibodies by WB - Rocky mtn spotted fvr abs pnl(IgG+IgM) - doxycycline (VIBRA-TABS) 100 MG tablet; Take 1 tablet (100 mg total) by mouth 2 (two) times daily.  Dispense: 28 tablet; Refill: 0  4. Nausea without vomiting - CBC with Differential/Platelet - COMPLETE METABOLIC PANEL WITH GFR - B. burgdorfi antibodies by WB - Rocky mtn spotted fvr abs pnl(IgG+IgM) - doxycycline (VIBRA-TABS) 100 MG tablet; Take 1 tablet (100 mg total) by mouth 2 (two) times daily.  Dispense: 28 tablet; Refill: 0  Will check lab including titers for Lyme and [facility] spotted fever. Will go ahead and start doxycycline to treat his respiratory infection and also to cover for Lyme and [facility] spotted fever. He states that he works at [employer]. Will go ahead and write him a note out today 5/16 through 5/18. Follow-up if symptoms worsen or do not resolve with completion of antibiotic.  Marin Olp [place], [place], [facility] 09/27/2014 2:01 PM

## 2014-09-29 LAB — ROCKY MTN SPOTTED FVR ABS PNL(IGG+IGM)
RMSF IGM: 0.23 IV
RMSF IgG: 4.39 IV — ABNORMAL HIGH

## 2014-09-29 LAB — B. BURGDORFI ANTIBODIES BY WB
B BURGDORFERI IGM ABS (IB): NEGATIVE
B burgdorferi IgG Abs (IB): NEGATIVE

## 2015-11-03 ENCOUNTER — Encounter: Payer: Self-pay | Admitting: Family Medicine

## 2015-11-03 ENCOUNTER — Ambulatory Visit (INDEPENDENT_AMBULATORY_CARE_PROVIDER_SITE_OTHER): Admitting: Family Medicine

## 2015-11-03 VITALS — BP 140/88 | HR 80 | Temp 98.4°F | Resp 16 | Ht 64.0 in | Wt 162.0 lb

## 2015-11-03 DIAGNOSIS — J302 Other seasonal allergic rhinitis: Secondary | ICD-10-CM

## 2015-11-03 DIAGNOSIS — G5601 Carpal tunnel syndrome, right upper limb: Secondary | ICD-10-CM | POA: Diagnosis not present

## 2015-11-03 MED ORDER — FLUTICASONE PROPIONATE 50 MCG/ACT NA SUSP: 2.0000 | Freq: Every day | NASAL | Status: DC

## 2015-11-03 NOTE — Progress Notes (Signed)
Subjective:    Patient ID: Jose Fisher, male    DOB: 05-28-1955, 60 y.o.   MRN: [id number]  HPI  Patient reports severe pain in the dorsum of his right wrist beginning on Sunday that radiates through his hand in between his third and fourth digits all the way to the fingertips. It can be intense and throbbing. He also reports pins and needles sensation. He denies any falls or injury. There is no tenderness to palpation. He has full range of motion in his wrist. There is no erythema or swelling. He also reports seasonal allergies with rhinorrhea and cough. He denies any fevers or chills or sinus pain. Past Medical History  Diagnosis Date  . Arthritis   . Wears glasses    Past Surgical History  Procedure Laterality Date  . Tonsillectomy    . Appendectomy    . Colectomy  1985    hemi  . Inguinal hernia repair      bilat  . Wrist ganglion excision  1978    lt  . Colonoscopy    . Shoulder arthroscopy  2011    RCR-DSC-right stayed [facility]  . Shoulder arthroscopy with rotator cuff repair and subacromial decompression Left 08/21/2012    Procedure: LEFT SHOULDER ARTHROSCOPY SUBACROMIAL DECOMPRESSION, DISTAL CLAVICLE RESECTION AND REPAIR ROTATOR CUFF;  Surgeon: Cammie Sickle., MD;  Location: [facility];  Service: Orthopedics;  Laterality: Left;   Current Outpatient Prescriptions on File Prior to Visit  Medication Sig Dispense Refill  . aspirin 81 MG tablet Take 81 mg by mouth daily.    . cholecalciferol (VITAMIN D) 1000 UNITS tablet Take 1,000 Units by mouth daily.    . Garlic 123XX123 MG TABS Take by mouth.    . Glucosamine-Chondroitin (OSTEO BI-FLEX REGULAR STRENGTH PO) Take by mouth.    Marland Kitchen ibuprofen (ADVIL,MOTRIN) 600 MG tablet Take 1 tablet (600 mg total) by mouth every 6 (six) hours as needed for pain. 30 tablet 1  . Multiple Vitamins-Minerals (MULTIVITAMIN WITH MINERALS) tablet Take 1 tablet by mouth daily.    . Omega-3 Fatty Acids (FISH OIL) 1000 MG CAPS Take by mouth.     . vitamin C (ASCORBIC ACID) 500 MG tablet Take 500 mg by mouth daily.     No current facility-administered medications on file prior to visit.   No Known Allergies Social History   Social History  . Marital Status: Married    Spouse Name: N/A  . Number of Children: N/A  . Years of Education: N/A   Occupational History  . Not on file.   Social History Main Topics  . Smoking status: Never Smoker   . Smokeless tobacco: Not on file  . Alcohol Use: Yes     Comment: occ  . Drug Use: No  . Sexual Activity: Not on file   Other Topics Concern  . Not on file   Social History Narrative     Review of Systems  All other systems reviewed and are negative.      Objective:   Physical Exam  HENT:  Right Ear: External ear normal.  Left Ear: External ear normal.  Nose: Mucosal edema and rhinorrhea present.  Mouth/Throat: Oropharynx is clear and moist.  Neck: Neck supple.  Cardiovascular: Normal rate, regular rhythm and normal heart sounds.   Pulmonary/Chest: Effort normal and breath sounds normal. No respiratory distress. He has no wheezes. He has no rales.  Musculoskeletal:       Right wrist: He exhibits  normal range of motion, no tenderness, no bony tenderness, no swelling, no effusion, no crepitus, no deformity and no laceration.  Lymphadenopathy:    He has no cervical adenopathy.  Vitals reviewed.         Assessment & Plan:  Seasonal allergies - Plan: fluticasone (FLONASE) 50 MCG/ACT nasal spray  Carpal tunnel syndrome of right wrist  I believe the patient has carpal tunnel syndrome. I recommended a cock up wrist splint for 2 weeks with ibuprofen 800 mg twice a day for 2 weeks. Try Flonase 2 sprays each nostril daily for seasonal allergies. Recheck in 2 weeks

## 2016-12-03 ENCOUNTER — Encounter: Payer: Self-pay | Admitting: Family Medicine

## 2016-12-03 ENCOUNTER — Ambulatory Visit (INDEPENDENT_AMBULATORY_CARE_PROVIDER_SITE_OTHER): Admitting: Family Medicine

## 2016-12-03 VITALS — BP 128/74 | HR 78 | Temp 98.2°F | Resp 16 | Ht 64.0 in | Wt 174.0 lb

## 2016-12-03 DIAGNOSIS — S39012A Strain of muscle, fascia and tendon of lower back, initial encounter: Secondary | ICD-10-CM | POA: Diagnosis not present

## 2016-12-03 DIAGNOSIS — Z114 Encounter for screening for human immunodeficiency virus [HIV]: Secondary | ICD-10-CM | POA: Diagnosis not present

## 2016-12-03 DIAGNOSIS — Z0000 Encounter for general adult medical examination without abnormal findings: Secondary | ICD-10-CM

## 2016-12-03 DIAGNOSIS — Z1159 Encounter for screening for other viral diseases: Secondary | ICD-10-CM

## 2016-12-03 LAB — COMPLETE METABOLIC PANEL WITH GFR
ALBUMIN: 4.3 g/dL (ref 3.6–5.1)
ALK PHOS: 49 U/L (ref 40–115)
ALT: 16 U/L (ref 9–46)
AST: 19 U/L (ref 10–35)
BILIRUBIN TOTAL: 0.6 mg/dL (ref 0.2–1.2)
BUN: 13 mg/dL (ref 7–25)
CO2: 26 mmol/L (ref 20–31)
Calcium: 9.3 mg/dL (ref 8.6–10.3)
Chloride: 105 mmol/L (ref 98–110)
Creat: 0.93 mg/dL (ref 0.70–1.25)
GFR, EST NON AFRICAN AMERICAN: 88 mL/min (ref >=60)
GFR, Est African American: >89 mL/min (ref >=60)
GLUCOSE: 79 mg/dL (ref 70–99)
Potassium: 4.7 mmol/L (ref 3.5–5.3)
SODIUM: 142 mmol/L (ref 135–146)
TOTAL PROTEIN: 6.6 g/dL (ref 6.1–8.1)

## 2016-12-03 LAB — CBC WITH DIFFERENTIAL/PLATELET
BASOS PCT: 1 %
Basophils Absolute: 56 cells/uL (ref 0–200)
EOS PCT: 2 %
Eosinophils Absolute: 112 cells/uL (ref 15–500)
HCT: 48.3 % (ref 38.5–50.0)
HEMOGLOBIN: 16.2 g/dL (ref 13.0–17.0)
LYMPHS ABS: 1848 {cells}/uL (ref 850–3900)
Lymphocytes Relative: 33 %
MCH: 30.7 pg (ref 27.0–33.0)
MCHC: 33.5 g/dL (ref 32.0–36.0)
MCV: 91.5 fL (ref 80.0–100.0)
MONOS PCT: 8 %
MPV: 11 fL (ref 7.5–12.5)
Monocytes Absolute: 448 cells/uL (ref 200–950)
NEUTROS ABS: 3136 {cells}/uL (ref 1500–7800)
Neutrophils Relative %: 56 %
PLATELETS: 204 10*3/uL (ref 140–400)
RBC: 5.28 MIL/uL (ref 4.20–5.80)
RDW: 13.7 % (ref 11.0–15.0)
WBC: 5.6 10*3/uL (ref 3.8–10.8)

## 2016-12-03 LAB — LIPID PANEL
CHOL/HDL RATIO: 2.7 ratio (ref <5.0)
Cholesterol: 200 mg/dL — ABNORMAL HIGH (ref <200)
HDL: 74 mg/dL (ref >40)
LDL CALC: 94 mg/dL (ref <100)
Triglycerides: 160 mg/dL — ABNORMAL HIGH (ref <150)
VLDL: 32 mg/dL — ABNORMAL HIGH (ref <30)

## 2016-12-03 NOTE — Progress Notes (Signed)
Subjective:    Patient ID: Jose Fisher, male    DOB: Aug 12, 1955, 61 y.o.   MRN: [id number]  HPI Patient is here today for complete physical exam. Past medical history is significant for colon cancer at age 60 status post surgical resection. Patient receives a colonoscopy every 3 years at the [facility] [place]. He will be due for his next colonoscopy next year. He is due for a PSA for prostate cancer screening. He is also due for the shingles shot as well as a tetanus shot. He defers a tetanus shot today but would like to receive the shingles vaccine. He also complains of some vague intermittent low back pain around the level of L5 on both sides. It seems to be muscular. He denies any sciatica or lumbar radiculopathy. However given his history of colon cancer, I do believe an x-ray of the spine is warranted. 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 Otherwise he is doing well aside from some intermittent mild knee pain. He is also due for hepatitis C screening as well as HIV screening and he consents to that today Past Medical History:  Diagnosis Date  . Arthritis   . Wears glasses    Past Surgical History:  Procedure Laterality Date  . APPENDECTOMY    . COLECTOMY  1985   hemi  . COLONOSCOPY    . INGUINAL HERNIA REPAIR     bilat  . SHOULDER ARTHROSCOPY  2011   RCR-DSC-right stayed [facility]  . SHOULDER ARTHROSCOPY WITH ROTATOR CUFF REPAIR AND SUBACROMIAL DECOMPRESSION Left 08/21/2012   Procedure: LEFT SHOULDER ARTHROSCOPY SUBACROMIAL DECOMPRESSION, DISTAL CLAVICLE RESECTION AND REPAIR ROTATOR CUFF;  Surgeon: Cammie Sickle., MD;  Location: [facility];  Service: Orthopedics;  Laterality: Left;  . TONSILLECTOMY    . WRIST GANGLION EXCISION  1978   lt   Current Outpatient Prescriptions on File Prior to Visit  Medication Sig Dispense Refill  . aspirin 81 MG tablet Take 81 mg by mouth daily.    . fluticasone (FLONASE) 50 MCG/ACT nasal spray  Place 2 sprays into both nostrils daily. (Patient taking differently: Place 2 sprays into both nostrils daily as needed. ) 16 g 6  . Garlic 258 MG TABS Take by mouth.    . Glucosamine-Chondroitin (OSTEO BI-FLEX REGULAR STRENGTH PO) Take by mouth.    . Multiple Vitamins-Minerals (MULTIVITAMIN WITH MINERALS) tablet Take 1 tablet by mouth daily.    . vitamin C (ASCORBIC ACID) 500 MG tablet Take 500 mg by mouth daily.     No current facility-administered medications on file prior to visit.    No Known Allergies Social History   Social History  . Marital status: Married    Spouse name: N/A  . Number of children: N/A  . Years of education: N/A   Occupational History  . Not on file.   Social History Main Topics  . Smoking status: Never Smoker  . Smokeless tobacco: Never Used  . Alcohol use Yes     Comment: occ  . Drug use: No  . Sexual activity: Not on file   Other Topics Concern  . Not on file   Social History Narrative  . No narrative on file   No family history on file.    Review of Systems  All other systems reviewed and are negative.      Objective:   Physical Exam  Constitutional: He is oriented to person, place, and  time. He appears well-developed and well-nourished. No distress.  HENT:  Head: Normocephalic and atraumatic.  Right Ear: External ear normal.  Left Ear: External ear normal.  Nose: Nose normal.  Mouth/Throat: Oropharynx is clear and moist. No oropharyngeal exudate.  Eyes: Pupils are equal, round, and reactive to light. Conjunctivae and EOM are normal. Right eye exhibits no discharge. Left eye exhibits no discharge. No scleral icterus.  Neck: Normal range of motion. Neck supple. No JVD present. No tracheal deviation present. No thyromegaly present.  Cardiovascular: Normal rate, regular rhythm, normal heart sounds and intact distal pulses.  Exam reveals no gallop and no friction rub.   No murmur heard. Pulmonary/Chest: Effort normal and breath sounds  normal. No stridor. No respiratory distress. He has no wheezes. He has no rales. He exhibits no tenderness.  Abdominal: Soft. Bowel sounds are normal. He exhibits no distension and no mass. There is no tenderness. There is no rebound and no guarding.  Genitourinary: Rectum normal, prostate normal and penis normal. No penile tenderness.  Musculoskeletal: Normal range of motion. He exhibits no edema or tenderness.  Lymphadenopathy:    He has no cervical adenopathy.  Neurological: He is alert and oriented to person, place, and time. He has normal reflexes. No cranial nerve deficit. He exhibits normal muscle tone. Coordination normal.  Skin: Skin is warm. No rash noted. He is not diaphoretic. No erythema. No pallor.  Psychiatric: He has a normal mood and affect. His behavior is normal. Judgment and thought content normal.  Vitals reviewed.         Assessment & Plan:  Strain of lumbar region, initial encounter - Plan: DG Lumbar Spine Complete, HIV antibody  General medical exam - Plan: CBC with Differential/Platelet, COMPLETE METABOLIC PANEL WITH GFR, Lipid panel, PSA  Encounter for hepatitis C screening test for low risk patient - Plan: Hepatitis C Ab Reflex HCV RNA, QUANT  Encounter for screening for HIV The patient's physical exam is normal. His blood pressure is excellent. He will receive the shingles vaccine. I would give him the tetanus shot next year. Colonoscopy is due next year. Check PSA today for prostate cancer. Screen the patient for HIV as well as hepatitis C. In addition check CBC, CMP, fasting lipid panel. Recommended a lumbar support at work. Proceed with an x-ray of the lumbar spine to evaluate further but I suspect that this is muscular pain

## 2016-12-04 LAB — HEPATITIS C ANTIBODY: HCV Ab: NEGATIVE

## 2016-12-04 LAB — PSA: PSA: 0.6 ng/mL (ref <=4.0)

## 2016-12-04 LAB — HIV ANTIBODY (ROUTINE TESTING W REFLEX): HIV: NONREACTIVE

## 2016-12-05 ENCOUNTER — Encounter: Payer: Self-pay | Admitting: Family Medicine

## 2016-12-10 ENCOUNTER — Ambulatory Visit
Admission: RE | Admit: 2016-12-10 | Discharge: 2016-12-10 | Disposition: A | Discharge status: Home / Self Care | Source: Ambulatory Visit | Attending: Family Medicine | Admitting: Family Medicine

## 2016-12-10 DIAGNOSIS — S39012A Strain of muscle, fascia and tendon of lower back, initial encounter: Secondary | ICD-10-CM

## 2016-12-12 ENCOUNTER — Encounter: Payer: Self-pay | Admitting: Family Medicine

## 2017-05-13 ENCOUNTER — Encounter: Payer: Self-pay | Admitting: Genetics

## 2017-05-13 ENCOUNTER — Other Ambulatory Visit

## 2017-05-13 ENCOUNTER — Ambulatory Visit (HOSPITAL_BASED_OUTPATIENT_CLINIC_OR_DEPARTMENT_OTHER): Admitting: Genetics

## 2017-05-13 DIAGNOSIS — Z860100 Personal history of colon polyps, unspecified: Secondary | ICD-10-CM

## 2017-05-13 DIAGNOSIS — Z85038 Personal history of other malignant neoplasm of large intestine: Secondary | ICD-10-CM

## 2017-05-13 DIAGNOSIS — Z800 Family history of malignant neoplasm of digestive organs: Secondary | ICD-10-CM | POA: Diagnosis not present

## 2017-05-13 DIAGNOSIS — Z8041 Family history of malignant neoplasm of ovary: Secondary | ICD-10-CM

## 2017-05-13 DIAGNOSIS — Z7183 Encounter for nonprocreative genetic counseling: Secondary | ICD-10-CM

## 2017-05-13 HISTORY — DX: Personal history of colon polyps, unspecified: Z86.0100

## 2017-05-13 HISTORY — DX: Personal history of colonic polyps: Z86.010

## 2017-05-13 HISTORY — DX: Personal history of other malignant neoplasm of large intestine: Z85.038

## 2017-05-13 NOTE — Progress Notes (Signed)
REFERRING PROVIDER: Susy Frizzle, MD [address] [place], [place] [address]  PRIMARY PROVIDER:  Susy Frizzle, MD  PRIMARY REASON FOR VISIT:  1. Family history of colon cancer   2. Family history of ovarian cancer   3. Personal history of colon cancer   4. Personal history of colonic polyps     HISTORY OF PRESENT ILLNESS:   Jose Fisher, a 61 y.o. male, was seen for a [facility] cancer genetics consultation via self referral/son due to a personal and family history of cancer.  Jose Fisher presents to clinic today to discuss the possibility of a hereditary predisposition to cancer, genetic testing, and to further clarify his future cancer risks, as well as potential cancer risks for family members.   At the age of 21, Jose Fisher was diagnosed with colon cancer.  He reports that half of his colon was removed.  He reports that since then it has been recommended that he have colonoscopies every 3 years.  He reports that he has had several polyps identified, and that most have been benign, although 1 was precancerous.  He reports that in the 1980's he received a letter from a research study he was part of that told him they identified a genetic cause for his colon cancer and that in the future his children should be aware of this.  He does not remember what gene/ condition or any other details about this letter.  He reports that if he can find it in his old records, he will email it to me.    He reports no history lip/mouth freckling or any blue colored freckles.   Past Medical History:  Diagnosis Date  . Arthritis   . Colon cancer ([employer])   . Family history of colon cancer   . Family history of ovarian cancer   . Personal history of colon cancer 05/13/2017  . Personal history of colonic polyps 05/13/2017  . Wears glasses     Past Surgical History:  Procedure Laterality Date  . APPENDECTOMY    . COLECTOMY  1985   hemi  . COLONOSCOPY    . INGUINAL HERNIA REPAIR      bilat  . SHOULDER ARTHROSCOPY  2011   [facility]-DSC-right stayed [facility]  . SHOULDER ARTHROSCOPY WITH ROTATOR CUFF REPAIR AND SUBACROMIAL DECOMPRESSION Left 08/21/2012   Procedure: LEFT SHOULDER ARTHROSCOPY SUBACROMIAL DECOMPRESSION, DISTAL CLAVICLE RESECTION AND REPAIR ROTATOR CUFF;  Surgeon: Cammie Sickle., MD;  Location: [facility];  Service: Orthopedics;  Laterality: Left;  . TONSILLECTOMY    . WRIST GANGLION EXCISION  1978   lt    Social History   Socioeconomic History  . Marital status: Married    Spouse name: None  . Number of children: None  . Years of education: None  . Highest education level: None  Social Needs  . Financial resource strain: None  . Food insecurity - worry: None  . Food insecurity - inability: None  . Transportation needs - medical: None  . Transportation needs - non-medical: None  Occupational History  . None  Tobacco Use  . Smoking status: Never Smoker  . Smokeless tobacco: Never Used  Substance and Sexual Activity  . Alcohol use: Yes    Comment: occ  . Drug use: No  . Sexual activity: None  Other Topics Concern  . None  Social History Narrative  . None     FAMILY HISTORY:  We obtained a detailed, 4-generation family history.  Significant diagnoses are listed below: Family History  Problem Relation Age of Onset  . Colon cancer Mother 38       says she also had 'intestinal cancer' in records  . Ovarian cancer Mother 42  . Liver cancer Mother   . Heart disease Father   . Stroke Father 43  . Colon polyps Son 87  . Heart disease Paternal Grandfather    Jose Fisher has a 49 year-old son who has recently had a colonoscopy that revealed 2 sessile serrated colon polyps and 1 hyperplastic polyp.   Jose Fisher has 2 paternal half-brothers ages 19 and the other in their 62's/30's.  He also has a paternal half-sister who is in her late 12's.  None of these half-siblings have any history of cancer.    Jose Fisher father died at 38 due  to a stroke and had a history of heart disease.  His paternal grandfather also had heart disease.    Jose Fisher mother had ovarian cancer in her 40's and waited until after having 1 child to have this 'cut out'.  She later developed colon cancer at 64, he says the term 'intestinal cancer' was also used in her records.  After treatment for this cancer, cancer was identified in her liver, and she died at 47.  No other information is know about Jose Fisher mother as she was raised in an orphanage.    Jose Fisher is unaware of previous family history of genetic testing for hereditary cancer risks. Patient's maternal ancestors are of [place] descent, and paternal ancestors are of Welsh/[place] descent. There is no reported Ashkenazi Jewish ancestry. There is no known consanguinity.  GENETIC COUNSELING ASSESSMENT: Jose Fisher is a 61 y.o. male with a personal and family history which is somewhat suggestive of a Hereditary Cancer Predisposition Syndrome. We, therefore, discussed and recommended the following at today's visit.   DISCUSSION: We reviewed the characteristics, features and inheritance patterns of hereditary cancer syndromes. We also discussed genetic testing, including the appropriate family members to test, the process of testing, insurance coverage and turn-around-time for results. We discussed the implications of a negative, positive and/or variant of uncertain significant result. We recommended Jose Fisher pursue genetic testing for the Common Hereditary Cancer gene panel. The Common Hereditary Cancer Panel offered by [facility] includes sequencing and/or deletion duplication testing of the following 47 genes: APC, ATM, AXIN2, BARD1, BMPR1A, BRCA1, BRCA2, BRIP1, CDH1, CDKN2A (p14ARF), CDKN2A (p16INK4a), CKD4, CHEK2, CTNNA1, DICER1, EPCAM (Deletion/duplication testing only), GREM1 (promoter region deletion/duplication testing only), KIT, MEN1, MLH1, MSH2, MSH3, MSH6, MUTYH, NBN, NF1, NHTL1, PALB2,  PDGFRA, PMS2, POLD1, POLE, PTEN, RAD50, RAD51C, RAD51D, SDHB, SDHC, SDHD, SMAD4, SMARCA4. STK11, TP53, TSC1, TSC2, and VHL.  The following genes were evaluated for sequence changes only: SDHA and HOXB13 c.251G>A variant only.  We discussed that only 5-10% of cancers are associated with a Hereditary Cancer Predisposition Syndrome.  The most common hereditary cancer syndrome associated with colon cancer is Lynch Syndrome.  Lynch Syndrome is caused by mutations in the genes: MLH1, MSH2, MSH6, PMS2 and EPCAM.  This syndrome increases the risk for colon, uterine, ovarian and stomach cancers, as well as others.  Families with Lynch Syndrome tend to have multiple family members with these cancers, typically diagnosed under age 55, and diagnoses in multiple generations.    We discussed that there are several other genes that are associated with an increased risk for colon cancer and increased polyp burden (MUTYH, APC, POLE, CHEK2, etc.) We also  dicussed that there are many genes that cause many different types of cancer risks.    We discussed that if he is found to have a mutation in one of these genes, it may impact future medical management recommendations such as increased cancer screenings and consideration of risk reducing surgeries.  A positive result could also have implications for the patient's family members.  A Negative result would mean we were unable to identify a hereditary component to her cancer, but does not rule out the possibility of a hereditary basis for her cancer.  There could be mutations that are undetectable by current technology, or in genes not yet tested or identified to increase cancer risk.    We discussed the potential to find a Variant of Uncertain Significance or VUS.  These are variants that have not yet been identified as pathogenic or benign, and it is unknown if this variant is associated with increased cancer risk or if this is a normal finding.  Most VUS's are reclassified  to benign or likely benign.   It should not be used to make medical management decisions. With time, we suspect the lab will determine the significance of any VUS's identified if any.   Based on Mr. Nienhuis personal and family history of cancer, he meets medical criteria for genetic testing. Despite that he meets criteria, he may still have an out of pocket cost. We discussed that if his out of pocket cost for testing is over $100, the laboratory will call and confirm whether he wants to proceed with testing.  If the out of pocket cost of testing is less than $100 he will be billed by the genetic testing laboratory.   PLAN: After considering the risks, benefits, and limitations, Mr. Discher  provided informed consent to pursue genetic testing and the blood sample was sent to [facility] for analysis of the Common Hereditary Cancer Panel. Results should be available within approximately 2-3 weeks' time, at which point they will be disclosed by telephone to Mr. Atkerson, as will any additional recommendations warranted by these results. Mr. Perfect will receive a summary of his genetic counseling visit and a copy of his results once available. This information will also be available in Epic. We encouraged Mr. Hane to remain in contact with cancer genetics annually so that we can continuously update the family history and inform him of any changes in cancer genetics and testing that may be of benefit for his family. Mr. Braithwaite questions were answered to his satisfaction today. Our contact information was provided should additional questions or concerns arise.  Based on Mr. Klecka family history, we recommended his maternal relatives, have genetic counseling and testing.  He reports he does not know who these relatives are, but should he ever be in contact with them we recommend they are aware of this history.   Mr. Lackey will let us know if we can be of any assistance in coordinating genetic  counseling and/or testing for this family member.   Lastly, we encouraged Mr. Dobrowski to remain in contact with cancer genetics annually so that we can continuously update the family history and inform him of any changes in cancer genetics and testing that may be of benefit for this family.   Mr.  Colocho questions were answered to his satisfaction today. Our contact information was provided should additional questions or concerns arise. Thank you for the referral and allowing [place] to share in the care of your patient.   Larose Kells.  Tamala Julian, Niland [email or web address] phone: [phone]  The patient was seen for a total of 60 minutes in face-to-face genetic counseling. The patient was accompanied today by his son, Marjory Lies and wife Santiago Glad.

## 2017-05-30 ENCOUNTER — Telehealth: Payer: Self-pay | Admitting: Genetics

## 2017-05-31 NOTE — Telephone Encounter (Signed)
Discolsed positive genetic test results to Mr. Joynt.  Told him that these results mean he has Lynch Syndrome.  His results indicate that their is a piece of Mr. Plancarte DNA that is missing/deleted and this piece includes the Lynch syndrome genes EPCAM and MSH2.   This likely explains his young diagnosis of colon cancer as well as puts him at risk to develop other types of cancer as well such as prostate, and other GI (stomach, panc, small bowel, etc), another colon cancer, and we would like to discuss screening options for him for colon and other types of cancer.  We discussed testing is son, Marjory Lies (who I also met with and counseled at previous appointment). Now that his son has a 50% chance to have inherited this deletion.  He will check with his family to determine the best time for Marjory Lies to come in for a blood draw so we can send testing for him.  After getting the results for both he and his son we plan to meet again to go over in more detail the implications of this test result and discuss screening options.

## 2017-06-12 ENCOUNTER — Ambulatory Visit (HOSPITAL_BASED_OUTPATIENT_CLINIC_OR_DEPARTMENT_OTHER): Admitting: Genetics

## 2017-06-12 DIAGNOSIS — Z860100 Personal history of colon polyps, unspecified: Secondary | ICD-10-CM

## 2017-06-12 DIAGNOSIS — Z8041 Family history of malignant neoplasm of ovary: Secondary | ICD-10-CM

## 2017-06-12 DIAGNOSIS — Z1509 Genetic susceptibility to other malignant neoplasm: Secondary | ICD-10-CM

## 2017-06-12 DIAGNOSIS — Z85038 Personal history of other malignant neoplasm of large intestine: Secondary | ICD-10-CM | POA: Diagnosis not present

## 2017-06-12 DIAGNOSIS — Z1379 Encounter for other screening for genetic and chromosomal anomalies: Secondary | ICD-10-CM

## 2017-06-12 DIAGNOSIS — Z1507 Genetic susceptibility to malignant neoplasm of urinary tract: Secondary | ICD-10-CM

## 2017-06-12 DIAGNOSIS — Z800 Family history of malignant neoplasm of digestive organs: Secondary | ICD-10-CM

## 2017-06-13 DIAGNOSIS — Z1379 Encounter for other screening for genetic and chromosomal anomalies: Secondary | ICD-10-CM | POA: Insufficient documentation

## 2017-06-13 DIAGNOSIS — Z1509 Genetic susceptibility to other malignant neoplasm: Secondary | ICD-10-CM | POA: Insufficient documentation

## 2017-06-13 NOTE — Progress Notes (Addendum)
REFERRING PROVIDER: Susy Frizzle, MD [address] [place],  [address]  PRIMARY PROVIDER:  Susy Frizzle, MD  PRIMARY REASON FOR VISIT:  1. Family history of colon cancer   2. Lynch syndrome   3. Genetic testing   4. Family history of ovarian cancer   5. Personal history of colon cancer   6. Personal history of colonic polyps     HPI:  Mr. Oelkers was previously seen in the [facility] clinic due to a personal and family of cancer and concerns regarding a hereditary predisposition to cancer. Please refer to our prior cancer genetics clinic note for more information regarding Mr. Lembo medical, social and family histories, and our assessment and recommendations, at the time. Mr. Charland recent genetic test results were disclosed to him, as were recommendations warranted by these results. These results and recommendations are discussed in more detail below.  CANCER HISTORY:   No history exists.    FAMILY HISTORY:  We obtained a detailed, 4-generation family history.  Significant diagnoses are listed below: Family History  Problem Relation Age of Onset  . Colon cancer Mother 41       says she also had 'intestinal cancer' in records  . Ovarian cancer Mother 27  . Liver cancer Mother   . Heart disease Father   . Stroke Father 28  . Colon polyps Son 57  . Heart disease Paternal Grandfather    Mr. Pangborn has a 67 year-old son who has recently had a colonoscopy that revealed 2 sessile serrated colon polyps and 1 hyperplastic polyp.  He also has another son who is 15 year old son with no history of cancer and no children. Mr. Brucato has 2 paternal half-brothers ages 89 and the other in their 57's/30's.  He also has a paternal half-sister who is in her late 51's.  None of these half-siblings have any history of cancer.    Mr. Pultz father died at 13 due to a stroke and had a history of heart disease.  His paternal grandfather also had  heart disease.    Mr. Kresse mother had ovarian cancer in her 49's and waited until after having 1 child to have this 'cut out'.  She later developed colon cancer at 30, he says the term 'intestinal cancer' was also used in her records.  After treatment for this cancer, cancer was identified in her liver, and she died at 40.  No other information is know about Mr. Soy mother as she was raised in an orphanage.    Mr. Cotto is unaware of previous family history of genetic testing for hereditary cancer risks. Patient's maternal ancestors are of [place] descent, and paternal ancestors are of Welsh/French descent. There is no reported Ashkenazi Jewish ancestry. There is no known consanguinity.  GENETIC TEST RESULTS: At the time of Mr. Kirker visit, we recommended he pursue genetic testing of the Multi-Cancer Panel. The Multi-Cancer Panel offered by [facility] includes sequencing and/or deletion duplication testing of the following 83 genes: ALK, APC, ATM, AXIN2,BAP1,  BARD1, BLM, BMPR1A, BRCA1, BRCA2, BRIP1, CASR, CDC73, CDH1, CDK4, CDKN1B, CDKN1C, CDKN2A (p14ARF), CDKN2A (p16INK4a), CEBPA, CHEK2, CTNNA1, DICER1, DIS3L2, EGFR (c.2369C>T, p.Thr790Met variant only), EPCAM (Deletion/duplication testing only), FH, FLCN, GATA2, GPC3, GREM1 (Promoter region deletion/duplication testing only), HOXB13 (c.251G>A, p.Gly84Glu), HRAS, KIT, MAX, MEN1, MET, MITF (c.952G>A, p.Glu318Lys variant only), MLH1, MSH2, MSH3, MSH6, MUTYH, NBN, NF1, NF2, NTHL1, PALB2, PDGFRA, PHOX2B, PMS2, POLD1, POLE, POT1, PRKAR1A, PTCH1, PTEN, RAD50, RAD51C,  RAD51D, RB1, RECQL4, RET, RUNX1, SDHAF2, SDHA (sequence changes only), SDHB, SDHC, SDHD, SMAD4, SMARCA4, SMARCB1, SMARCE1, STK11, SUFU, TERC, TERT, TMEM127, TP53, TSC1, TSC2, VHL, WRN and WT1.   Genetic testing identified 2 heterozygous pathogenic gene variants in the genes EPCAM and MSH2.  The entire coding region of EPCAM and exons 1-15 of MSH2 are deleted.  This result is consistent  with a single deletion event.  The 3' end of this deletion is likely confiend to intron 68 of MSH2, however the 5' end of this deletion is unknown as it extends beyond the assayed region of this panel test.     This result is consistent with a diagnosis of Lynch Syndrome.  A copy of the test report has been scanned into Epic for review.   A variant of uncertain significance in the gene RET was also noted c.1493C>T (p.Ala498Val).   SCREENING RECOMMENDATIONS: We discussed the implications of Lynch syndrome for Mr. Nicol, and discussed who else in the family should have genetic testing.   Individuals with Lynch syndrome have a significantly increased risk to develop colon and uterine cancer.  There are also increased risks for stomach, pancreatic, small bowel, ovarian, urothelial, CNS, sebaceous neoplasms,  and potentially prostate cancers.     We recommended Mr. Randazzo follow management guidelines for Lynch syndrome; all of which are outlined below. Mr. Bisping has requested these be coordinated by Mr. Douthit Gastroenterologist group at [facility].     1.  Colonoscopy is recommended every 1-2 years beginning at age 43-25 or 2-5 years prior to the earliest colon cancer diagnosis.   2. While there is no clear evidence to support screening for stomach, duodenal, and small bowel cancer, an upper endoscopy can be considered at 3-5 year intervals beginning at age 85-35. However, the decision to have this screening is best determined by the gastroenterologist. His mother's cancer was called an "intestinal cancer" and therefore he is unsure if it was cancer of the small or large intestine.  Given this uncertainty in the family history we recommend he discuss this option with is gastroenterologist.   3.  Annual urinalysis can be considered and discussed in selected individuals (such as those with a family history of urothelial cancer or males with MSH2 mutations). This screening is suggested to  start at age 79-30.  Because Mr. Nocito has a MSH2 mutation, this type of screening could be considered.  However, there is insufficient evidence make clear recommendations about this type of screening.   4. Consider annual physical/neurological exam starting 25-30 given small increased risk for CNS cancers.   5. Increased risk for prostate cancer in men with Lynch Syndrome has been reported.  No recommendations exist for earlier or more frequent prostate cancer screening in men with Lynch syndrome.  However, they are recommended to follow the prostate cancer early detection general screening guidelines.    For women with Lynch syndrome, there is increased risk to develop uterine and ovarian cancer. Women should establish an individualized surveillance plan with their doctors.   1. Women should seek medical attention if they experience abnormal vaginal bleeding.   2. Some providers may still recommend vaginal ultrasounds, uterine biopsies (for uterine cancer risk) and/or CA-125 analysis ( for ovarian cancer risk), even though these have not been shown to be effective.  3. A hysterectomy with removal of the ovaries and fallopian tubes should be considered once childbearing is completed (if planned).  Please note, these NCCN guidelines change over time and physicians coordinating  screening should check and be aware of any changes in medical guidelines.   DELETION EVENT: Mr. Satz result is consistent with a single deletion event.  This genetic test only analyzed a panel of specific cancer predisposition genes.  The genes EPCAM and MSH2 are located next to each other in the genome.  The result indicates the 3' end of this deletion is confined to intron 15 of MSH2,  However, the entire coding region of EPCAM was deleted, and this genetic test did not analyze beyond the coding region of this gene.  Therefore the 5' end of this deletion is currently unknown.  We do not know where the breakpoint is  and how large/how many genes could be involved in this deletion event.    We discussed that if other genes are involved in this deletion event (ex: cardiac function, CNS function, etc.) there could be other health risks, or there may not be any other health issues related to this deletion.  There is simply no way to know how many genes/how large it is without further analysis.  We offered Mr. Horrigan the option to be referred to a general adult geneticist to perform more extensive testing such as a chromosome microarray, etc.  Mr. Shaff son has intellectual disability and is being tested for this deletion as well.  If this deletion is something that his son also carries, this may or may not be related to his intellectual disabilities.  It is reassuring that Mr. Jessop has lived to 28 without any additional health issues (besides cancer dx), but these deletions can have variable expressitivity and affect different people differently.  A deletion in Mr. Recker may present differently than a deletion in his son, other relatives, etc.   Mr. Prats would like to discuss this further with his family and decide if he would like to pursue additional testing/genetic consultation to determine the extent of this deletion.   FAMILY MEMBERS:  All of Mr. Posthumus relatives are at risk to also have Lynch Syndrome.  We explained that each of his sons has a 50% chance to have inherited this deletion.    At this time we do not know if he inherited this deletion from his mother or father's side of the family.  Based on the family history is seems most likely this was inherited from his mother.  But until this is confirmed we recommend that his paternal half-siblings and paternal relatives have genetic testing for this deletion.  His mother was raised in an orphanage and therefore he has no information or contact with his maternal relatives.   We will be happy to meet with any of his family members or refer them to a  genetic counselor in their local area. To locate genetic counselors in other cities, individuals can visit the website of the [employer] ([employer]ist[employer].se) and Secretary/administrator for a Social worker by zip code.    We provided Mr. Kinzler information about the patient support group [employer] for individuals with Lynch Syndrome.    PLAN:  1. These results will be sent to his gastroenterologist group at [facility] and he would like them to follow him for this indication of Lynch Syndrome.   2. These results will also be sent to his primary care physician, Dr. Dennard Schaumann.    Lastly, we discussed with Mr. Mcdanel that he should remain in contact with [place] in cancer genetics on an annual basis so we can update Mr. Shakespeare personal and family  histories and inform him of advances in cancer genetics that may be of benefit for the entire family. he also knows to call us if we can be of any further assistance.  Tana Felts MS Genetic Counselor [email or web address] phone: [phone]

## 2017-06-17 ENCOUNTER — Encounter: Payer: Self-pay | Admitting: Family Medicine

## 2017-12-05 ENCOUNTER — Encounter: Payer: Self-pay | Admitting: Family Medicine

## 2017-12-05 ENCOUNTER — Ambulatory Visit (INDEPENDENT_AMBULATORY_CARE_PROVIDER_SITE_OTHER): Admitting: Family Medicine

## 2017-12-05 VITALS — BP 118/86 | HR 82 | Temp 98.3°F | Resp 14 | Ht 64.0 in | Wt 173.0 lb

## 2017-12-05 DIAGNOSIS — Z1509 Genetic susceptibility to other malignant neoplasm: Secondary | ICD-10-CM

## 2017-12-05 DIAGNOSIS — Z86010 Personal history of colonic polyps: Secondary | ICD-10-CM

## 2017-12-05 DIAGNOSIS — Z125 Encounter for screening for malignant neoplasm of prostate: Secondary | ICD-10-CM

## 2017-12-05 DIAGNOSIS — Z85038 Personal history of other malignant neoplasm of large intestine: Secondary | ICD-10-CM

## 2017-12-05 DIAGNOSIS — Z0000 Encounter for general adult medical examination without abnormal findings: Secondary | ICD-10-CM

## 2017-12-05 DIAGNOSIS — Z13220 Encounter for screening for lipoid disorders: Secondary | ICD-10-CM | POA: Diagnosis not present

## 2017-12-05 NOTE — Progress Notes (Signed)
Subjective:    Patient ID: Jose Fisher, male    DOB: Feb 11, 1956, 62 y.o.   MRN: [id number]  HPI Patient is here today for complete physical exam. Past medical history is significant for colon cancer at age 49 status post surgical resection. Patient receives a colonoscopy every 3 years at the [place]. he states that his colonoscopy is scheduled next year.  Otherwise he is doing well.  His only concern is occasional word finding aphasia.  For instance he will know the word he is trying to say however it will seemingly be on the tip of his tongue and he will be unable to think of it.  He denies any other memory problems.  This happens infrequently.  He wanted to bring it to light to make me aware of it.  His immunization record is listed below Immunization History  Administered Date(s) Administered  . Influenza,inj,Quad PF,6+ Mos 05/27/2014, 03/23/2017   He is due for Pneumovax 23 and Prevnar 13 at age 63.  He is due for a flu shot this fall.  He is also due for prostate cancer screening.  His hepatitis C screening was performed last year and was normal Past Medical History:  Diagnosis Date  . Arthritis   . Colon cancer ([place])   . Family history of colon cancer   . Family history of ovarian cancer   . Lynch syndrome    increased risk of cancers of renal pelvis, ureter, stomach, small bowel, bile duct, skin (sebaceous neoplasms), and brain (gliomas  . Personal history of colon cancer 05/13/2017  . Personal history of colonic polyps 05/13/2017  . Wears glasses    Past Surgical History:  Procedure Laterality Date  . APPENDECTOMY    . COLECTOMY  1985   hemi  . COLONOSCOPY    . INGUINAL HERNIA REPAIR     bilat  . SHOULDER ARTHROSCOPY  2011   RCR-DSC-right stayed [facility]  . SHOULDER ARTHROSCOPY WITH ROTATOR CUFF REPAIR AND SUBACROMIAL DECOMPRESSION Left 08/21/2012   Procedure: LEFT SHOULDER ARTHROSCOPY SUBACROMIAL DECOMPRESSION, DISTAL CLAVICLE RESECTION AND REPAIR ROTATOR CUFF;  Surgeon: Cammie Sickle., MD;  Location: [place];  Service: Orthopedics;  Laterality: Left;  . TONSILLECTOMY    . WRIST GANGLION EXCISION  1978   lt   Current Outpatient Medications on File Prior to Visit  Medication Sig Dispense Refill  . aspirin 81 MG tablet Take 81 mg by mouth daily.    Marland Kitchen BETAINE PO Take by mouth.    Marland Kitchen CINNAMON PO Take by mouth.    . Echinacea 125 MG TABS Take by mouth.    . ferrous sulfate 325 (65 FE) MG tablet Take 325 mg by mouth daily with breakfast.    . Flaxseed, Linseed, (FLAX SEED OIL PO) Take 1,200 mg by mouth daily.     . fluticasone (FLONASE) 50 MCG/ACT nasal spray Place 2 sprays into both nostrils daily. (Patient taking differently: Place 2 sprays into both nostrils daily as needed. ) 16 g 6  . Garlic 025 MG TABS Take by mouth.    . Ginkgo Biloba 100 MG CAPS Take by mouth.    . Glucosamine-Chondroitin (MOVE FREE PO) Take by mouth.    . Misc Natural Products (SUPER ENERGY PO) Take by mouth.    . Multiple Vitamins-Minerals (MULTIVITAMIN WITH MINERALS) tablet Take 1 tablet by mouth daily.    Marland Kitchen OVER THE COUNTER MEDICATION Prostene TID    . OVER THE COUNTER MEDICATION Ubinquinal 100mg   1 daily    . PSYLLIUM PO Take by mouth.    . TURMERIC CURCUMIN PO Take by mouth.    . vitamin C (ASCORBIC ACID) 500 MG tablet Take 500 mg by mouth daily.    . vitamin E (VITAMIN E) 400 UNIT capsule Take 400 Units by mouth once a week.    . vitamin k 100 MCG tablet Take 100 mcg by mouth daily.    . Cholecalciferol (VITAMIN D3) 5000 units CAPS Take by mouth.     No current facility-administered medications on file prior to visit.    No Known Allergies Social History   Socioeconomic History  . Marital status: Married    Spouse name: Not on file  . Number of children: Not on file  . Years of education: Not on file  . Highest education level: Not on file  Occupational History  . Not on file  Social Needs  . Financial resource strain: Not on file  . Food insecurity:      Worry: Not on file    Inability: Not on file  . Transportation needs:    Medical: Not on file    Non-medical: Not on file  Tobacco Use  . Smoking status: Never Smoker  . Smokeless tobacco: Never Used  Substance and Sexual Activity  . Alcohol use: Yes    Comment: occ  . Drug use: No  . Sexual activity: Not on file  Lifestyle  . Physical activity:    Days per week: Not on file    Minutes per session: Not on file  . Stress: Not on file  Relationships  . Social connections:    Talks on phone: Not on file    Gets together: Not on file    Attends religious service: Not on file    Active member of club or organization: Not on file    Attends meetings of clubs or organizations: Not on file    Relationship status: Not on file  . Intimate partner violence:    Fear of current or ex partner: Not on file    Emotionally abused: Not on file    Physically abused: Not on file    Forced sexual activity: Not on file  Other Topics Concern  . Not on file  Social History Narrative  . Not on file   Family History  Problem Relation Age of Onset  . Colon cancer Mother 58       says she also had 'intestinal cancer' in records  . Ovarian cancer Mother 51  . Liver cancer Mother   . Heart disease Father   . Stroke Father 90  . Colon polyps Son 75  . Heart disease Paternal Grandfather       Review of Systems  All other systems reviewed and are negative.      Objective:   Physical Exam  Constitutional: He is oriented to person, place, and time. He appears well-developed and well-nourished. No distress.  HENT:  Head: Normocephalic and atraumatic.  Right Ear: External ear normal.  Left Ear: External ear normal.  Nose: Nose normal.  Mouth/Throat: Oropharynx is clear and moist. No oropharyngeal exudate.  Eyes: Pupils are equal, round, and reactive to light. Conjunctivae and EOM are normal. Right eye exhibits no discharge. Left eye exhibits no discharge. No scleral icterus.  Neck:  Normal range of motion. Neck supple. No JVD present. No tracheal deviation present. No thyromegaly present.  Cardiovascular: Normal rate, regular rhythm, normal heart sounds and intact  distal pulses. Exam reveals no gallop and no friction rub.  No murmur heard. Pulmonary/Chest: Effort normal and breath sounds normal. No stridor. No respiratory distress. He has no wheezes. He has no rales. He exhibits no tenderness.  Abdominal: Soft. Bowel sounds are normal. He exhibits no distension and no mass. There is no tenderness. There is no rebound and no guarding.  Genitourinary: Rectum normal, prostate normal and penis normal. No penile tenderness.  Musculoskeletal: Normal range of motion. He exhibits no edema or tenderness.  Lymphadenopathy:    He has no cervical adenopathy.  Neurological: He is alert and oriented to person, place, and time. He has normal reflexes. No cranial nerve deficit. He exhibits normal muscle tone. Coordination normal.  Skin: Skin is warm. No rash noted. He is not diaphoretic. No erythema. No pallor.  Psychiatric: He has a normal mood and affect. His behavior is normal. Judgment and thought content normal.  Vitals reviewed.         Assessment & Plan:  Prostate cancer screening - Plan: PSA  Screening cholesterol level - Plan: CBC with Differential/Platelet, COMPLETE METABOLIC PANEL WITH GFR, Lipid panel  General medical exam - Plan: CBC with Differential/Platelet, COMPLETE METABOLIC PANEL WITH GFR, Lipid panel, PSA, CANCELED: Hepatitis C Antibody  Lynch syndrome  Personal history of colonic polyps  Personal history of colon cancer  Patient's physical exam is normal. He received his flu shot today. I will also check a CBC, CMP, fasting lipid panel, and PSA. The remainder of his exam is normal. His colonoscopy will be performed at the [place]. patient may be due for a tetanus shot today however he is uncertain of the data which he had his last tetanus shot.  He would like to  wait until next year to receive his tetanus shot prior to going on Medicare.  Otherwise his immunizations are up-to-date.  His cancer screening is up-to-date.  Prostate exam was normal.  Regular anticipatory guidance is provided.  I have recommended that we monitor the patient's memory problems.  At the present time I reassured the patient I believe what he is experiencing is very common and does not necessarily reflect developing dementia.  We will continue to monitor this.  If it worsens and he develops associated memory deficits, we will certainly begin our work-up

## 2017-12-06 ENCOUNTER — Encounter (INDEPENDENT_AMBULATORY_CARE_PROVIDER_SITE_OTHER): Payer: Self-pay

## 2017-12-06 LAB — COMPLETE METABOLIC PANEL WITH GFR
AG RATIO: 1.8 (calc) (ref 1.0–2.5)
ALT: 20 U/L (ref 9–46)
AST: 26 U/L (ref 10–35)
Albumin: 4.6 g/dL (ref 3.6–5.1)
Alkaline phosphatase (APISO): 56 U/L (ref 40–115)
BILIRUBIN TOTAL: 0.8 mg/dL (ref 0.2–1.2)
BUN: 12 mg/dL (ref 7–25)
CALCIUM: 10 mg/dL (ref 8.6–10.3)
CHLORIDE: 101 mmol/L (ref 98–110)
CO2: 30 mmol/L (ref 20–32)
Creat: 1.07 mg/dL (ref 0.70–1.25)
GFR, EST AFRICAN AMERICAN: 85 mL/min/{1.73_m2} (ref >=60)
GFR, EST NON AFRICAN AMERICAN: 73 mL/min/{1.73_m2} (ref >=60)
Globulin: 2.5 g/dL (calc) (ref 1.9–3.7)
Glucose, Bld: 87 mg/dL (ref 65–99)
POTASSIUM: 4 mmol/L (ref 3.5–5.3)
SODIUM: 140 mmol/L (ref 135–146)
Total Protein: 7.1 g/dL (ref 6.1–8.1)

## 2017-12-06 LAB — CBC WITH DIFFERENTIAL/PLATELET
BASOS ABS: 70 {cells}/uL (ref 0–200)
Basophils Relative: 1.1 %
EOS ABS: 179 {cells}/uL (ref 15–500)
Eosinophils Relative: 2.8 %
HCT: 47.8 % (ref 38.5–50.0)
Hemoglobin: 16.8 g/dL (ref 13.2–17.1)
Lymphs Abs: 2419 cells/uL (ref 850–3900)
MCH: 30.3 pg (ref 27.0–33.0)
MCHC: 35.1 g/dL (ref 32.0–36.0)
MCV: 86.1 fL (ref 80.0–100.0)
MPV: 11.2 fL (ref 7.5–12.5)
Monocytes Relative: 7.3 %
NEUTROS PCT: 51 %
Neutro Abs: 3264 cells/uL (ref 1500–7800)
PLATELETS: 214 10*3/uL (ref 140–400)
RBC: 5.55 10*6/uL (ref 4.20–5.80)
RDW: 12.8 % (ref 11.0–15.0)
TOTAL LYMPHOCYTE: 37.8 %
WBC mixed population: 467 cells/uL (ref 200–950)
WBC: 6.4 10*3/uL (ref 3.8–10.8)

## 2017-12-06 LAB — LIPID PANEL
Cholesterol: 194 mg/dL (ref <200)
HDL: 94 mg/dL (ref >40)
LDL Cholesterol (Calc): 83 mg/dL (calc)
NON-HDL CHOLESTEROL (CALC): 100 mg/dL (ref <130)
TRIGLYCERIDES: 83 mg/dL (ref <150)
Total CHOL/HDL Ratio: 2.1 (calc) (ref <5.0)

## 2017-12-06 LAB — PSA: PSA: 0.9 ng/mL (ref <=4.0)

## 2018-09-24 ENCOUNTER — Other Ambulatory Visit: Payer: Self-pay

## 2018-09-25 ENCOUNTER — Other Ambulatory Visit: Payer: Self-pay

## 2018-09-25 ENCOUNTER — Encounter: Payer: Self-pay | Admitting: Family Medicine

## 2018-09-25 ENCOUNTER — Ambulatory Visit (INDEPENDENT_AMBULATORY_CARE_PROVIDER_SITE_OTHER): Admitting: Family Medicine

## 2018-09-25 ENCOUNTER — Ambulatory Visit
Admission: RE | Admit: 2018-09-25 | Discharge: 2018-09-25 | Disposition: A | Discharge status: Home / Self Care | Source: Ambulatory Visit | Attending: Family Medicine | Admitting: Family Medicine

## 2018-09-25 VITALS — BP 130/80 | HR 80 | Temp 98.6°F | Resp 18 | Ht 64.0 in | Wt 174.0 lb

## 2018-09-25 DIAGNOSIS — M25562 Pain in left knee: Secondary | ICD-10-CM

## 2018-09-25 MED ORDER — DICLOFENAC SODIUM 75 MG PO TBEC: 75.0000 mg | DELAYED_RELEASE_TABLET | Freq: Two times a day (BID) | ORAL | 0 refills | Status: DC

## 2018-09-25 NOTE — Progress Notes (Signed)
Subjective:    Patient ID: Jose Fisher, male    DOB: 12-Jun-1955, 63 y.o.   MRN: [id number]  HPI Patient presents with the acute onset of left knee pain.  Began 1 week ago while kneeling to plant his garden and cover the delicate plants with plastic for the upcoming frost.  As he stood to get up, he felt sharp sudden pain behind his kneecap and also on the posterior aspect of his knee on the medial aspect.  Since that time he is had severe pain in his knee under the patella and is well over the medial joint line.  He also reports swelling posteriorly and a small joint effusion.  He is tried ibuprofen with no relief.  He has tried a knee brace with some minimal relief.  Over the last week the pain persists prompting him to come in for a visit today.  He has no laxity to varus or valgus stress.  He has a negative anterior and posterior drawer sign.  However Apley grind test elicits severe pain in the posterior medial area of the knee.  He also has some tenderness to palpation over the medial joint line and a small effusion Past Medical History:  Diagnosis Date  . Arthritis   . Colon cancer ([place])   . Family history of colon cancer   . Family history of ovarian cancer   . Lynch syndrome    increased risk of cancers of renal pelvis, ureter, stomach, small bowel, bile duct, skin (sebaceous neoplasms), and brain (gliomas  . Personal history of colon cancer 05/13/2017  . Personal history of colonic polyps 05/13/2017  . Wears glasses    Past Surgical History:  Procedure Laterality Date  . APPENDECTOMY    . COLECTOMY  1985   hemi  . COLONOSCOPY    . INGUINAL HERNIA REPAIR     bilat  . SHOULDER ARTHROSCOPY  2011   RCR-DSC-right stayed [facility]  . SHOULDER ARTHROSCOPY WITH ROTATOR CUFF REPAIR AND SUBACROMIAL DECOMPRESSION Left 08/21/2012   Procedure: LEFT SHOULDER ARTHROSCOPY SUBACROMIAL DECOMPRESSION, DISTAL CLAVICLE RESECTION AND REPAIR ROTATOR CUFF;  Surgeon: Cammie Sickle., MD;  Location:  [place];  Service: Orthopedics;  Laterality: Left;  . TONSILLECTOMY    . WRIST GANGLION EXCISION  1978   lt   Current Outpatient Medications on File Prior to Visit  Medication Sig Dispense Refill  . aspirin 81 MG tablet Take 81 mg by mouth daily.    Marland Kitchen BETAINE PO Take by mouth.    . Cholecalciferol (VITAMIN D3) 5000 units CAPS Take by mouth.    Marland Kitchen CINNAMON PO Take by mouth.    . Echinacea 125 MG TABS Take by mouth.    . ferrous sulfate 325 (65 FE) MG tablet Take 325 mg by mouth daily with breakfast.    . Flaxseed, Linseed, (FLAX SEED OIL PO) Take 1,200 mg by mouth daily.     . fluticasone (FLONASE) 50 MCG/ACT nasal spray Place 2 sprays into both nostrils daily. (Patient taking differently: Place 2 sprays into both nostrils daily as needed. ) 16 g 6  . Garlic 094 MG TABS Take by mouth.    . Ginkgo Biloba 100 MG CAPS Take by mouth.    . Glucosamine-Chondroitin (MOVE FREE PO) Take by mouth.    . Misc Natural Products (SUPER ENERGY PO) Take by mouth.    . Multiple Vitamins-Minerals (MULTIVITAMIN WITH MINERALS) tablet Take 1 tablet by mouth daily.    Marland Kitchen  OVER THE COUNTER MEDICATION Prostene TID    . OVER THE COUNTER MEDICATION Ubinquinal 100mg  1 daily    . PSYLLIUM PO Take by mouth.    . TURMERIC CURCUMIN PO Take by mouth.    . vitamin C (ASCORBIC ACID) 500 MG tablet Take 500 mg by mouth daily.    . vitamin E (VITAMIN E) 400 UNIT capsule Take 400 Units by mouth once a week.    . vitamin k 100 MCG tablet Take 100 mcg by mouth daily.     No current facility-administered medications on file prior to visit.    No Known Allergies Social History   Socioeconomic History  . Marital status: Married    Spouse name: Not on file  . Number of children: Not on file  . Years of education: Not on file  . Highest education level: Not on file  Occupational History  . Not on file  Social Needs  . Financial resource strain: Not on file  . Food insecurity:    Worry: Not on file     Inability: Not on file  . Transportation needs:    Medical: Not on file    Non-medical: Not on file  Tobacco Use  . Smoking status: Never Smoker  . Smokeless tobacco: Never Used  Substance and Sexual Activity  . Alcohol use: Yes    Comment: occ  . Drug use: No  . Sexual activity: Not on file  Lifestyle  . Physical activity:    Days per week: Not on file    Minutes per session: Not on file  . Stress: Not on file  Relationships  . Social connections:    Talks on phone: Not on file    Gets together: Not on file    Attends religious service: Not on file    Active member of club or organization: Not on file    Attends meetings of clubs or organizations: Not on file    Relationship status: Not on file  . Intimate partner violence:    Fear of current or ex partner: Not on file    Emotionally abused: Not on file    Physically abused: Not on file    Forced sexual activity: Not on file  Other Topics Concern  . Not on file  Social History Narrative  . Not on file      Review of Systems  All other systems reviewed and are negative.      Objective:   Physical Exam Vitals signs reviewed.  Cardiovascular:     Rate and Rhythm: Normal rate and regular rhythm.  Pulmonary:     Effort: Pulmonary effort is normal.     Breath sounds: Normal breath sounds.  Musculoskeletal:     Left knee: He exhibits decreased range of motion, swelling, effusion and abnormal meniscus. He exhibits no ecchymosis, no deformity, no erythema, no LCL laxity, no bony tenderness and no MCL laxity. Tenderness found. Medial joint line tenderness noted.           Assessment & Plan:  Left knee pain, unspecified chronicity - Plan: DG Knee Complete 4 Views Left  I suspect a medial meniscal tear.  We discussed cortisone injections versus conservative therapy with a knee brace and NSAIDs.  Patient elects to try NSAIDs and a knee brace.  Therefore we will start the patient on diclofenac 75 mg p.o. twice daily  as well as a knee brace and reassess in 1 to 2 weeks.  Obtain x-ray of the  left knee to evaluate further for any other potential underlying problems.  If the pain persist over the next 1 to 2 weeks I would recommend a cortisone injection and if the pain worsens despite this, I would recommend an MRI of the knee to evaluate for meniscal tear.

## 2018-10-09 ENCOUNTER — Other Ambulatory Visit: Payer: Self-pay | Admitting: Family Medicine

## 2018-10-09 MED ORDER — DICLOFENAC SODIUM 75 MG PO TBEC: 75.0000 mg | DELAYED_RELEASE_TABLET | Freq: Two times a day (BID) | ORAL | 1 refills | Status: DC

## 2018-11-27 ENCOUNTER — Ambulatory Visit (INDEPENDENT_AMBULATORY_CARE_PROVIDER_SITE_OTHER): Admitting: Family Medicine

## 2018-11-27 ENCOUNTER — Encounter: Payer: Self-pay | Admitting: Family Medicine

## 2018-11-27 ENCOUNTER — Other Ambulatory Visit: Payer: Self-pay

## 2018-11-27 VITALS — BP 110/68 | HR 88 | Temp 98.8°F | Resp 16 | Ht 64.0 in | Wt 170.0 lb

## 2018-11-27 DIAGNOSIS — M25562 Pain in left knee: Secondary | ICD-10-CM

## 2018-11-27 DIAGNOSIS — M25532 Pain in left wrist: Secondary | ICD-10-CM

## 2018-11-27 MED ORDER — DICLOFENAC SODIUM 75 MG PO TBEC: 75.0000 mg | DELAYED_RELEASE_TABLET | Freq: Two times a day (BID) | ORAL | 0 refills | Status: DC
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
## 2018-12-01 ENCOUNTER — Ambulatory Visit
Admission: RE | Admit: 2018-12-01 | Discharge: 2018-12-01 | Disposition: A | Discharge status: Home / Self Care | Source: Ambulatory Visit | Attending: Family Medicine | Admitting: Family Medicine

## 2018-12-01 DIAGNOSIS — M25532 Pain in left wrist: Secondary | ICD-10-CM

## 2018-12-31 ENCOUNTER — Other Ambulatory Visit: Payer: Self-pay | Admitting: Family Medicine

## 2018-12-31 DIAGNOSIS — M25532 Pain in left wrist: Secondary | ICD-10-CM

## 2019-07-20 IMAGING — CR DG LUMBAR SPINE COMPLETE 4+V
5 series · 5 of 5 positions shown · non-contrast
Comparison: None.

CLINICAL DATA: 60-year-old male low back pain for 3 months. No
injury. Initial encounter.

EXAM:
LUMBAR SPINE - COMPLETE 4+ VIEW

[t l-spine a.p.]
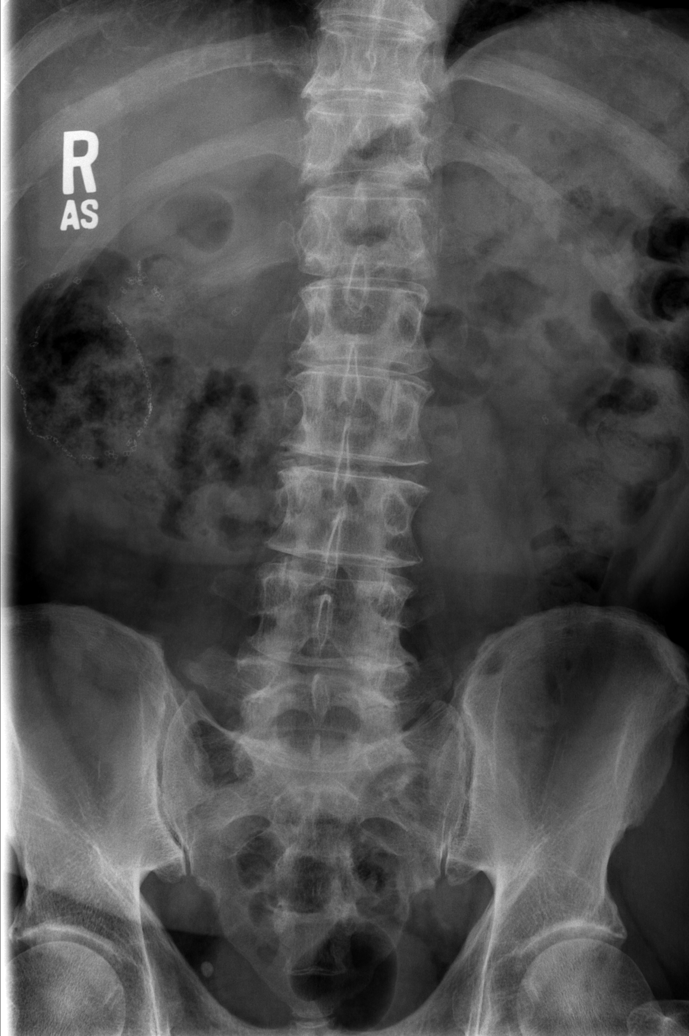

[t l-spine oblique exposure (1 of 2)]
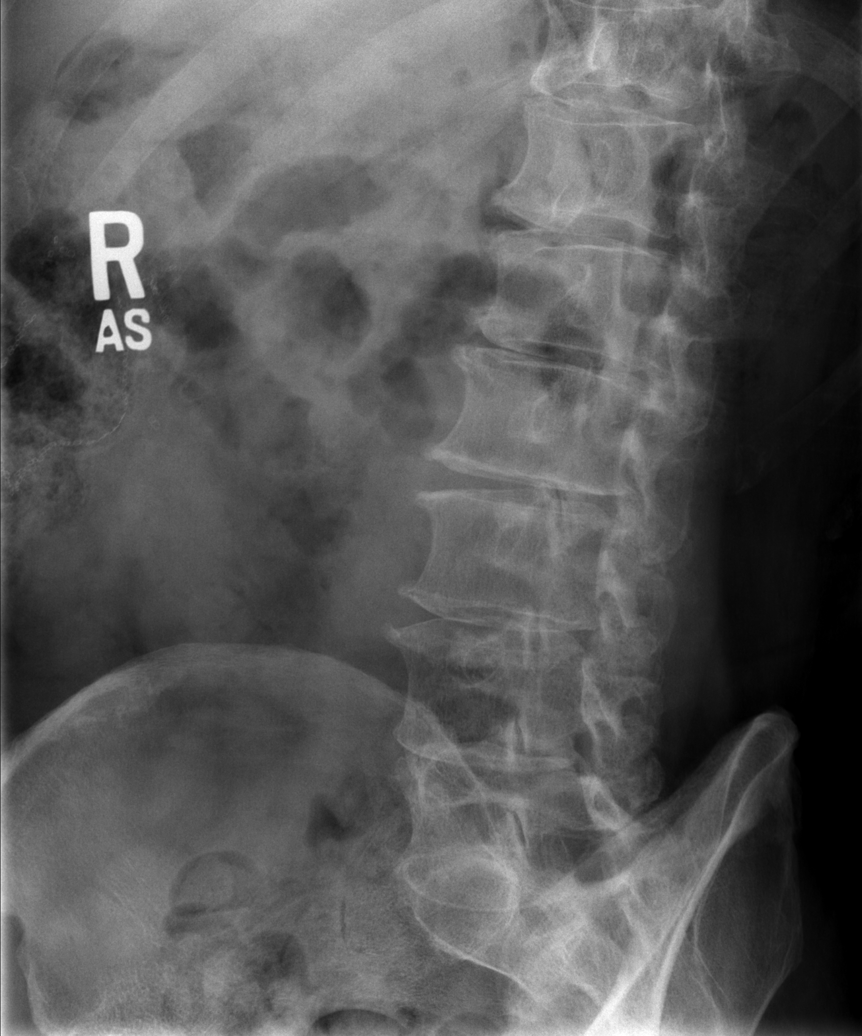

[t l-spine oblique exposure (2 of 2)]
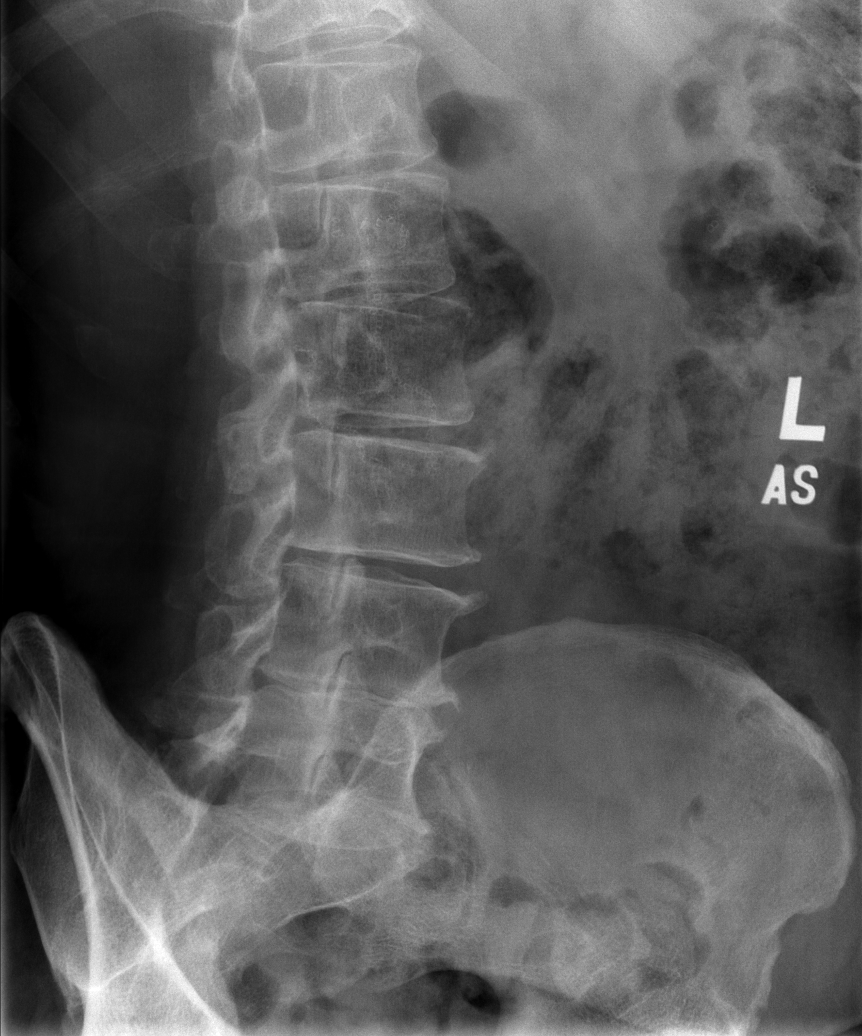

[t l-spine lat]
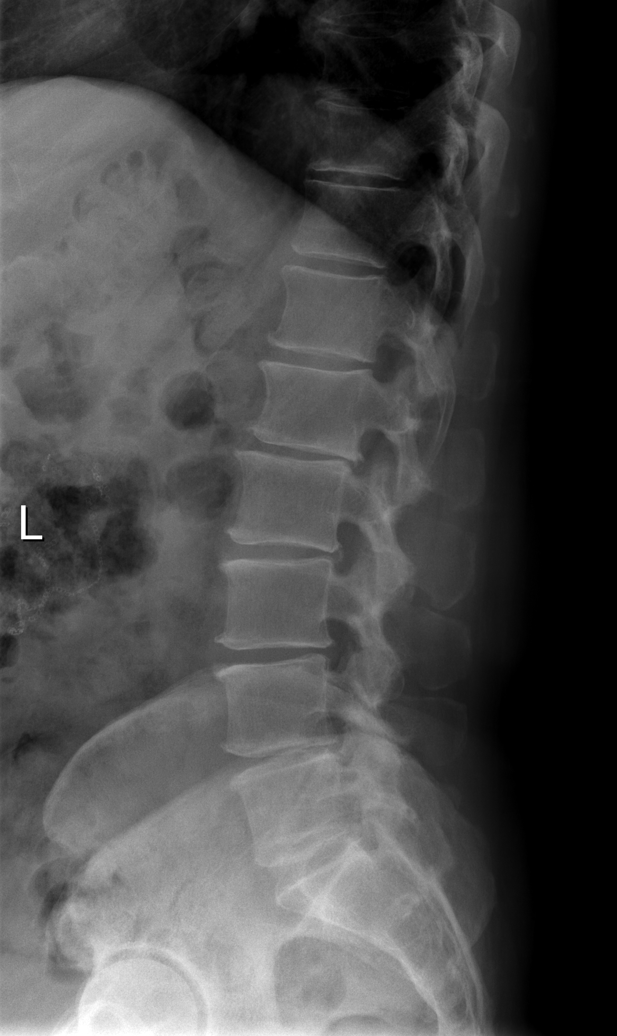

[t l-spine l5-s1 spot]
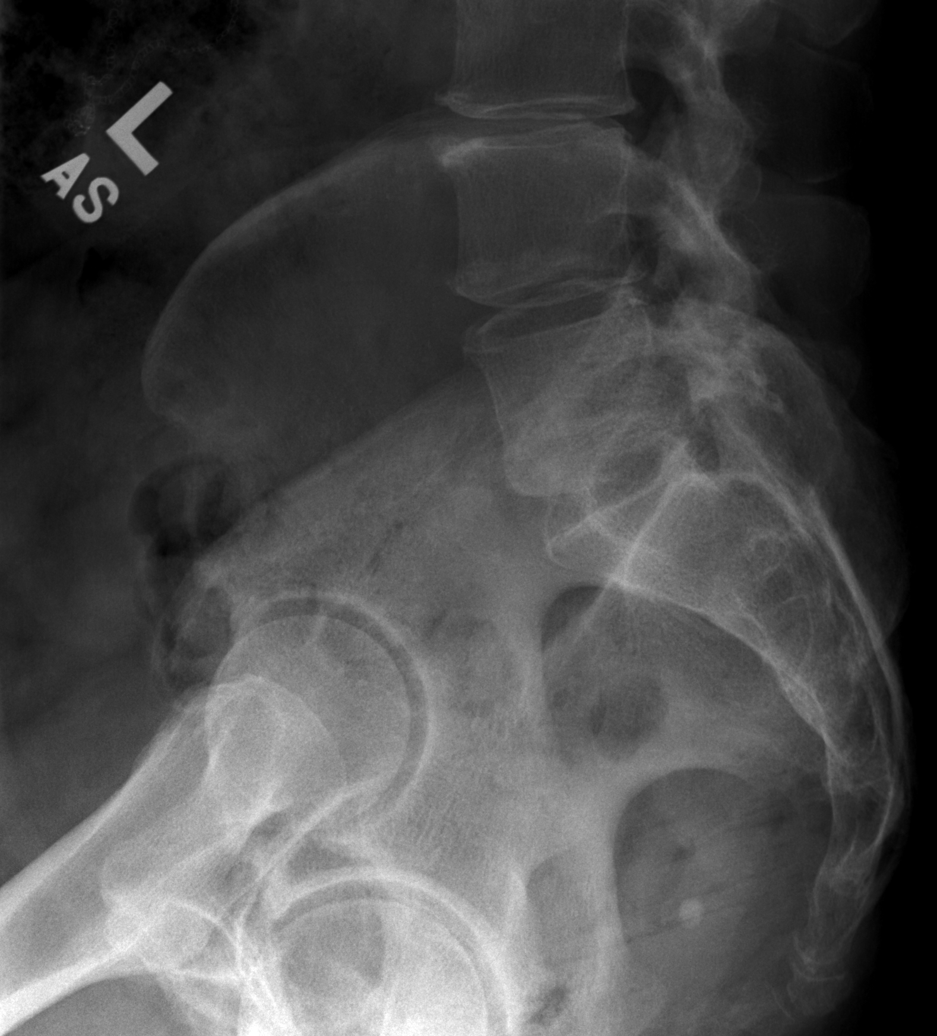

[5 of 5 positions shown; findings below may reference images not displayed]

FINDINGS: Minimal curvature convex left.

Mild to slightly moderate disc space narrowing L1-2 through L4-5.
Mild L5-S1 disc space narrowing.

No pars defect detected.

Surgical staples right abdomen.
IMPRESSION: Mild to slightly moderate disc space narrowing L1-2 through L4-5.

Mild L5-S1 disc space narrowing.

## 2019-10-05 ENCOUNTER — Encounter (HOSPITAL_COMMUNITY): Payer: Self-pay

## 2019-10-05 ENCOUNTER — Emergency Department (HOSPITAL_COMMUNITY)
Admission: EM | Admit: 2019-10-05 | Discharge: 2019-10-05 | Disposition: A | Discharge status: Home / Self Care | Payer: Medicare Other | Attending: Emergency Medicine | Admitting: Emergency Medicine

## 2019-10-05 ENCOUNTER — Other Ambulatory Visit: Payer: Self-pay

## 2019-10-05 ENCOUNTER — Emergency Department (HOSPITAL_COMMUNITY): Payer: Medicare Other

## 2019-10-05 DIAGNOSIS — S20312A Abrasion of left front wall of thorax, initial encounter: Secondary | ICD-10-CM | POA: Diagnosis not present

## 2019-10-05 DIAGNOSIS — S40212A Abrasion of left shoulder, initial encounter: Secondary | ICD-10-CM | POA: Diagnosis not present

## 2019-10-05 DIAGNOSIS — Z85038 Personal history of other malignant neoplasm of large intestine: Secondary | ICD-10-CM | POA: Insufficient documentation

## 2019-10-05 DIAGNOSIS — S60512A Abrasion of left hand, initial encounter: Secondary | ICD-10-CM | POA: Diagnosis not present

## 2019-10-05 DIAGNOSIS — R0789 Other chest pain: Secondary | ICD-10-CM | POA: Diagnosis present

## 2019-10-05 DIAGNOSIS — Z7982 Long term (current) use of aspirin: Secondary | ICD-10-CM | POA: Diagnosis not present

## 2019-10-05 DIAGNOSIS — Y999 Unspecified external cause status: Secondary | ICD-10-CM | POA: Insufficient documentation

## 2019-10-05 DIAGNOSIS — Y939 Activity, unspecified: Secondary | ICD-10-CM | POA: Diagnosis not present

## 2019-10-05 DIAGNOSIS — Y929 Unspecified place or not applicable: Secondary | ICD-10-CM | POA: Diagnosis not present

## 2019-10-05 DIAGNOSIS — Z79899 Other long term (current) drug therapy: Secondary | ICD-10-CM | POA: Diagnosis not present

## 2019-10-05 MED ORDER — ACETAMINOPHEN 500 MG PO TABS
1000.0000 mg | ORAL_TABLET | Freq: Once | ORAL | Status: AC
  Administered 2019-10-05: 1000 mg via ORAL
  Filled 2019-10-05: qty 2
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
## 2019-10-05 NOTE — Discharge Instructions (Addendum)
  Expect your soreness to increase over the next 2-3 days. Take it easy, but do not lay around too much as this may make any stiffness worse.  Antiinflammatory medications: Take 600 mg of ibuprofen every 6 hours or 440 mg (over the counter dose) to 500 mg (prescription dose) of naproxen every 12 hours for the next 3 days. After this time, these medications may be used as needed for pain. Take these medications with food to avoid upset stomach. Choose only one of these medications, do not take them together. Acetaminophen (generic for Tylenol): Should you continue to have additional pain while taking the ibuprofen or naproxen, you may add in acetaminophen as needed. Your daily total maximum amount of acetaminophen from all sources should be limited to 4000mg /day for persons without liver problems, or 2000mg /day for those with liver problems. Lidocaine patches: These are available via either prescription or over-the-counter. The over-the-counter option may be more economical one and are likely just as effective. There are multiple over-the-counter brands, such as Salonpas. Ice: May apply ice to the area over the next 24 hours for 15 minutes at a time to reduce pain, inflammation, and swelling, if present. Exercises: Be sure to perform the attached exercises starting with three times a week and working up to performing them daily. This is an essential part of preventing long term problems.  Follow up: Follow up with a primary care provider for any future management of these complaints. Be sure to follow up within 7-10 days. Return: Return to the ED should symptoms worsen.  For prescription assistance, may try using prescription discount sites or apps, such as goodrx.com

## 2019-10-09 ENCOUNTER — Ambulatory Visit: Admitting: Family Medicine

## 2019-10-13 ENCOUNTER — Encounter: Payer: Self-pay | Admitting: Family Medicine

## 2019-10-13 ENCOUNTER — Ambulatory Visit (INDEPENDENT_AMBULATORY_CARE_PROVIDER_SITE_OTHER): Payer: Medicare Other | Admitting: Family Medicine

## 2019-10-13 ENCOUNTER — Other Ambulatory Visit: Payer: Self-pay

## 2019-10-13 VITALS — BP 150/84 | HR 72 | Temp 98.4°F | Resp 16 | Ht 64.0 in | Wt 172.0 lb

## 2019-10-13 DIAGNOSIS — R413 Other amnesia: Secondary | ICD-10-CM | POA: Insufficient documentation

## 2019-10-13 DIAGNOSIS — S8001XA Contusion of right knee, initial encounter: Secondary | ICD-10-CM | POA: Diagnosis not present

## 2019-10-13 DIAGNOSIS — S134XXA Sprain of ligaments of cervical spine, initial encounter: Secondary | ICD-10-CM

## 2019-10-13 MED ORDER — MELOXICAM 15 MG PO TABS: 15.0000 mg | ORAL_TABLET | Freq: Every day | ORAL | 0 refills | Status: DC

## 2019-10-13 MED ORDER — TIZANIDINE HCL 4 MG PO TABS: 4.0000 mg | ORAL_TABLET | Freq: Four times a day (QID) | ORAL | 0 refills | Status: DC | PRN
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
## 2019-10-14 ENCOUNTER — Ambulatory Visit
Admission: RE | Admit: 2019-10-14 | Discharge: 2019-10-14 | Disposition: A | Discharge status: Home / Self Care | Payer: Medicare Other | Source: Ambulatory Visit | Attending: Family Medicine | Admitting: Family Medicine

## 2019-10-14 DIAGNOSIS — S134XXA Sprain of ligaments of cervical spine, initial encounter: Secondary | ICD-10-CM

## 2019-10-14 DIAGNOSIS — S8001XA Contusion of right knee, initial encounter: Secondary | ICD-10-CM

## 2019-10-17 ENCOUNTER — Other Ambulatory Visit: Payer: Self-pay | Admitting: Family Medicine

## 2019-10-20 ENCOUNTER — Other Ambulatory Visit: Payer: Self-pay

## 2019-10-20 MED ORDER — TIZANIDINE HCL 4 MG PO TABS: 4.0000 mg | ORAL_TABLET | Freq: Four times a day (QID) | ORAL | 0 refills | Status: DC | PRN

## 2019-10-20 NOTE — Progress Notes (Signed)
Last refilled: 10/13/2019 Last office visit: 10/13/2019

## 2019-10-25 ENCOUNTER — Other Ambulatory Visit: Payer: Self-pay | Admitting: Family Medicine

## 2019-10-26 NOTE — Telephone Encounter (Signed)
Ok to refill 

## 2019-10-31 ENCOUNTER — Other Ambulatory Visit: Payer: Self-pay | Admitting: Family Medicine

## 2019-11-02 ENCOUNTER — Ambulatory Visit (INDEPENDENT_AMBULATORY_CARE_PROVIDER_SITE_OTHER): Payer: Medicare Other | Admitting: Family Medicine

## 2019-11-02 ENCOUNTER — Encounter: Payer: Self-pay | Admitting: Family Medicine

## 2019-11-02 ENCOUNTER — Other Ambulatory Visit: Payer: Self-pay

## 2019-11-02 VITALS — BP 132/64 | HR 92 | Temp 98.7°F | Resp 14 | Ht 64.0 in | Wt 160.0 lb

## 2019-11-02 DIAGNOSIS — Z1509 Genetic susceptibility to other malignant neoplasm: Secondary | ICD-10-CM | POA: Diagnosis not present

## 2019-11-02 DIAGNOSIS — Z85038 Personal history of other malignant neoplasm of large intestine: Secondary | ICD-10-CM

## 2019-11-02 DIAGNOSIS — R634 Abnormal weight loss: Secondary | ICD-10-CM

## 2019-11-02 DIAGNOSIS — R1013 Epigastric pain: Secondary | ICD-10-CM

## 2019-11-02 DIAGNOSIS — R112 Nausea with vomiting, unspecified: Secondary | ICD-10-CM

## 2019-11-02 MED ORDER — OMEPRAZOLE 40 MG PO CPDR
40.0000 mg | DELAYED_RELEASE_CAPSULE | Freq: Every day | ORAL | 1 refills | Status: DC
Start: 2019-11-02 — End: 2019-11-19

## 2019-11-02 NOTE — Patient Instructions (Signed)
 Imaging for CT scan  Omeprazole once a day  Stop the meloxicam
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
## 2019-11-02 NOTE — Telephone Encounter (Signed)
Ok to refill 

## 2019-11-03 ENCOUNTER — Encounter: Payer: Self-pay | Admitting: Family Medicine

## 2019-11-03 ENCOUNTER — Ambulatory Visit
Admission: RE | Admit: 2019-11-03 | Discharge: 2019-11-03 | Disposition: A | Discharge status: Home / Self Care | Payer: Medicare Other | Source: Ambulatory Visit | Attending: Family Medicine | Admitting: Family Medicine

## 2019-11-03 DIAGNOSIS — Z85038 Personal history of other malignant neoplasm of large intestine: Secondary | ICD-10-CM

## 2019-11-03 DIAGNOSIS — Z1509 Genetic susceptibility to other malignant neoplasm: Secondary | ICD-10-CM

## 2019-11-03 DIAGNOSIS — R112 Nausea with vomiting, unspecified: Secondary | ICD-10-CM

## 2019-11-03 DIAGNOSIS — R634 Abnormal weight loss: Secondary | ICD-10-CM

## 2019-11-03 DIAGNOSIS — R1013 Epigastric pain: Secondary | ICD-10-CM

## 2019-11-03 LAB — CBC WITH DIFFERENTIAL/PLATELET
Absolute Monocytes: 614 cells/uL (ref 200–950)
Basophils Absolute: 40 cells/uL (ref 0–200)
Basophils Relative: 0.6 %
Eosinophils Absolute: 211 cells/uL (ref 15–500)
Eosinophils Relative: 3.2 %
HCT: 49 % (ref 38.5–50.0)
Hemoglobin: 17.1 g/dL (ref 13.2–17.1)
Lymphs Abs: 2132 cells/uL (ref 850–3900)
MCH: 30.9 pg (ref 27.0–33.0)
MCHC: 34.9 g/dL (ref 32.0–36.0)
MCV: 88.6 fL (ref 80.0–100.0)
MPV: 11.8 fL (ref 7.5–12.5)
Monocytes Relative: 9.3 %
Neutro Abs: 3604 cells/uL (ref 1500–7800)
Neutrophils Relative %: 54.6 %
Platelets: 215 10*3/uL (ref 140–400)
RBC: 5.53 10*6/uL (ref 4.20–5.80)
RDW: 12.5 % (ref 11.0–15.0)
Total Lymphocyte: 32.3 %
WBC: 6.6 10*3/uL (ref 3.8–10.8)

## 2019-11-03 LAB — BASIC METABOLIC PANEL
BUN: 21 mg/dL (ref 7–25)
CO2: 28 mmol/L (ref 20–32)
Calcium: 10.5 mg/dL — ABNORMAL HIGH (ref 8.6–10.3)
Chloride: 101 mmol/L (ref 98–110)
Creat: 1.07 mg/dL (ref 0.70–1.25)
Glucose, Bld: 92 mg/dL (ref 65–99)
Potassium: 3.5 mmol/L (ref 3.5–5.3)
Sodium: 141 mmol/L (ref 135–146)

## 2019-11-03 LAB — LIPASE: Lipase: 44 U/L (ref 7–60)
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
## 2019-11-07 ENCOUNTER — Other Ambulatory Visit: Payer: Self-pay | Admitting: Family Medicine

## 2019-11-09 ENCOUNTER — Other Ambulatory Visit: Payer: Self-pay | Admitting: Family Medicine

## 2019-11-09 NOTE — Telephone Encounter (Signed)
Meloxicam was not seen on medication list.

## 2019-11-09 NOTE — Telephone Encounter (Signed)
Ok to refill 

## 2019-11-10 ENCOUNTER — Other Ambulatory Visit: Payer: Self-pay

## 2019-11-13 ENCOUNTER — Other Ambulatory Visit: Payer: Self-pay | Admitting: Family Medicine

## 2019-11-13 NOTE — Telephone Encounter (Signed)
Requested Prescriptions   Pending Prescriptions Disp Refills   tiZANidine (ZANAFLEX) 4 MG tablet [Pharmacy Med Name: TIZANIDINE 4MG  TABLETS] 30 tablet 0    Sig: TAKE 1 TABLET(4 MG) BY MOUTH EVERY 6 HOURS AS NEEDED FOR MUSCLE SPASMS     Last OV 11/02/2019   Last ordered 11/03/2019

## 2019-11-19 ENCOUNTER — Ambulatory Visit (INDEPENDENT_AMBULATORY_CARE_PROVIDER_SITE_OTHER): Payer: Medicare Other | Admitting: Family Medicine

## 2019-11-19 ENCOUNTER — Other Ambulatory Visit: Payer: Self-pay

## 2019-11-19 VITALS — BP 120/82 | HR 59 | Temp 97.0°F | Wt 167.0 lb

## 2019-11-19 DIAGNOSIS — R413 Other amnesia: Secondary | ICD-10-CM | POA: Diagnosis not present

## 2019-11-19 DIAGNOSIS — R634 Abnormal weight loss: Secondary | ICD-10-CM

## 2019-11-19 DIAGNOSIS — R1013 Epigastric pain: Secondary | ICD-10-CM

## 2019-11-19 MED ORDER — OMEPRAZOLE 40 MG PO CPDR: 40.0000 mg | DELAYED_RELEASE_CAPSULE | Freq: Every day | ORAL | 1 refills | Status: DC
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
## 2019-11-20 ENCOUNTER — Telehealth: Payer: Self-pay | Admitting: Family Medicine

## 2019-11-20 NOTE — Telephone Encounter (Signed)
CB# [phone] Pt stop by to inform Dr.Pickard of the medication that they spoke about on his appt the medication name is tizanidine 4 MG Tablets

## 2019-12-01 MED ORDER — TIZANIDINE HCL 4 MG PO TABS: ORAL_TABLET | ORAL | 0 refills | Status: DC

## 2019-12-01 NOTE — Telephone Encounter (Signed)
Ok to refill 

## 2019-12-01 NOTE — Telephone Encounter (Signed)
ok 

## 2019-12-06 ENCOUNTER — Other Ambulatory Visit: Payer: Self-pay | Admitting: Family Medicine

## 2019-12-07 NOTE — Telephone Encounter (Signed)
Ok to refill 

## 2019-12-22 ENCOUNTER — Other Ambulatory Visit: Payer: Self-pay | Admitting: Family Medicine

## 2019-12-22 DIAGNOSIS — R1013 Epigastric pain: Secondary | ICD-10-CM

## 2020-02-12 ENCOUNTER — Ambulatory Visit
Admission: RE | Admit: 2020-02-12 | Discharge: 2020-02-12 | Disposition: A | Discharge status: Home / Self Care | Payer: Medicare Other | Source: Ambulatory Visit | Attending: Family Medicine | Admitting: Family Medicine

## 2020-02-12 ENCOUNTER — Ambulatory Visit (INDEPENDENT_AMBULATORY_CARE_PROVIDER_SITE_OTHER): Payer: Medicare Other | Admitting: Family Medicine

## 2020-02-12 ENCOUNTER — Other Ambulatory Visit: Payer: Self-pay

## 2020-02-12 VITALS — BP 130/80 | HR 63 | Temp 98.0°F | Ht 64.0 in | Wt 165.0 lb

## 2020-02-12 DIAGNOSIS — M25561 Pain in right knee: Secondary | ICD-10-CM

## 2020-02-12 DIAGNOSIS — R413 Other amnesia: Secondary | ICD-10-CM | POA: Diagnosis not present

## 2020-02-12 DIAGNOSIS — R112 Nausea with vomiting, unspecified: Secondary | ICD-10-CM

## 2020-02-12 DIAGNOSIS — M5431 Sciatica, right side: Secondary | ICD-10-CM

## 2020-02-12 DIAGNOSIS — M25562 Pain in left knee: Secondary | ICD-10-CM

## 2020-02-12 MED ORDER — MELOXICAM 7.5 MG PO TABS
7.5000 mg | ORAL_TABLET | Freq: Every day | ORAL | 1 refills | Status: DC | PRN
Start: 2020-02-12 — End: 2021-01-20

## 2020-02-12 NOTE — Progress Notes (Signed)
Subjective:    Patient ID: Jose Fisher, male    DOB: [birth year], 64 y.o.   MRN: [id number]  I saw the patient for unintentional weight loss back in the summer and thought he had NSAID induced gastritis.  He states that he is not been taking any other NSAIDs since that time.  However he comes in today seemingly very confused.  He begins by complaining that he wished I would send him somewhere else when he got so sick last time.  However I never saw him for diarrhea.  He states that he had diarrhea.  His wife is with him and states that he is confused.  She has no recollection of this either.  I am not sure as to what he is referring.  He is unable to provide additional history.  He states that Sunday he vomited 1 time.  After that he developed diarrhea 2-3 times a day that subsided on Thursday.  He states that over the last 24 hours he has had no further diarrhea and his stomach is feeling better.  He denies any fevers or chills.  He did have a fever on Sunday but none since that time.  He denies any melena or hematochezia.  He then complains of pain in his lower back radiating into his posterior right hip and down his right leg to his knee.  He also complains of bilateral knee pain.  Tylenol is not helping the pain.  He is not taking any NSAID per his report.  However again he appears confused.  I noticed this at his last visit.  Past Medical History:  Diagnosis Date  . Arthritis   . Colon cancer ([place])   . Family history of colon cancer   . Family history of ovarian cancer   . Lynch syndrome    increased risk of cancers of renal pelvis, ureter, stomach, small bowel, bile duct, skin (sebaceous neoplasms), and brain (gliomas  . Memory loss    MRI- mild atrophy, microvascular changes, left temporal encephalomalacia (2021) at [facility]  . Personal history of colon cancer 05/13/2017  . Personal history of colonic polyps 05/13/2017  . Wears glasses    Past Surgical History:  Procedure Laterality Date    . APPENDECTOMY    . COLECTOMY  1985   hemi  . COLONOSCOPY    . INGUINAL HERNIA REPAIR     bilat  . SHOULDER ARTHROSCOPY  2011   RCR-DSC-right stayed RCC  . SHOULDER ARTHROSCOPY WITH ROTATOR CUFF REPAIR AND SUBACROMIAL DECOMPRESSION Left 08/21/2012   Procedure: LEFT SHOULDER ARTHROSCOPY SUBACROMIAL DECOMPRESSION, DISTAL CLAVICLE RESECTION AND REPAIR ROTATOR CUFF;  Surgeon: Cammie Sickle., MD;  Location: [facility];  Service: Orthopedics;  Laterality: Left;  . TONSILLECTOMY    . WRIST GANGLION EXCISION  1978   lt   Current Outpatient Medications on File Prior to Visit  Medication Sig Dispense Refill  . aspirin 81 MG tablet Take 81 mg by mouth daily.    Marland Kitchen BETAINE PO Take by mouth.    . Cholecalciferol (VITAMIN D3) 5000 units CAPS Take by mouth.    Marland Kitchen CINNAMON PO Take by mouth.    . Echinacea 125 MG TABS Take by mouth.    . ferrous sulfate 325 (65 FE) MG tablet Take 325 mg by mouth daily with breakfast.    . Flaxseed, Linseed, (FLAX SEED OIL PO) Take 1,200 mg by mouth daily.     . fluticasone (FLONASE) 50 MCG/ACT nasal spray  Place 2 sprays into both nostrils daily. (Patient taking differently: Place 2 sprays into both nostrils daily as needed. ) 16 g 6  . Garlic 384 MG TABS Take by mouth.    . Ginkgo Biloba 100 MG CAPS Take by mouth.    . Glucosamine-Chondroitin (MOVE FREE PO) Take by mouth.    . Misc Natural Products (SUPER ENERGY PO) Take by mouth.    . Multiple Vitamins-Minerals (MULTIVITAMIN WITH MINERALS) tablet Take 1 tablet by mouth daily.    Marland Kitchen omeprazole (PRILOSEC) 40 MG capsule TAKE 1 CAPSULE(40 MG) BY MOUTH DAILY 90 capsule 1  . OVER THE COUNTER MEDICATION Prostene TID    . OVER THE COUNTER MEDICATION Ubinquinal 100mg  1 daily    . PSYLLIUM PO Take by mouth.    Marland Kitchen tiZANidine (ZANAFLEX) 4 MG tablet TAKE 1 TABLET(4 MG) BY MOUTH EVERY 6 HOURS AS NEEDED FOR MUSCLE SPASMS 30 tablet 0  . TURMERIC CURCUMIN PO Take by mouth.    . vitamin C (ASCORBIC ACID) 500 MG  tablet Take 500 mg by mouth daily.    . vitamin E (VITAMIN E) 400 UNIT capsule Take 400 Units by mouth once a week.    . vitamin k 100 MCG tablet Take 100 mcg by mouth daily.     No current facility-administered medications on file prior to visit.   No Known Allergies Social History   Socioeconomic History  . Marital status: Married    Spouse name: Not on file  . Number of children: Not on file  . Years of education: Not on file  . Highest education level: Not on file  Occupational History  . Not on file  Tobacco Use  . Smoking status: Never Smoker  . Smokeless tobacco: Never Used  Substance and Sexual Activity  . Alcohol use: Yes    Comment: occ  . Drug use: No  . Sexual activity: Not on file  Other Topics Concern  . Not on file  Social History Narrative  . Not on file   Social Determinants of Health   Financial Resource Strain:   . Difficulty of Paying Living Expenses: Not on file  Food Insecurity:   . Worried About Charity fundraiser in the Last Year: Not on file  . Ran Out of Food in the Last Year: Not on file  Transportation Needs:   . Lack of Transportation (Medical): Not on file  . Lack of Transportation (Non-Medical): Not on file  Physical Activity:   . Days of Exercise per Week: Not on file  . Minutes of Exercise per Session: Not on file  Stress:   . Feeling of Stress : Not on file  Social Connections:   . Frequency of Communication with Friends and Family: Not on file  . Frequency of Social Gatherings with Friends and Family: Not on file  . Attends Religious Services: Not on file  . Active Member of Clubs or Organizations: Not on file  . Attends Archivist Meetings: Not on file  . Marital Status: Not on file  Intimate Partner Violence:   . Fear of Current or Ex-Partner: Not on file  . Emotionally Abused: Not on file  . Physically Abused: Not on file  . Sexually Abused: Not on file      Review of Systems  All other systems reviewed and  are negative.      Objective:   Physical Exam Vitals reviewed.  Cardiovascular:     Rate and Rhythm: Normal rate  and regular rhythm.  Pulmonary:     Effort: Pulmonary effort is normal.     Breath sounds: Normal breath sounds.  Abdominal:     General: Abdomen is flat. Bowel sounds are normal. There is no distension. There are no signs of injury.     Palpations: Abdomen is soft. There is no hepatomegaly or mass.     Tenderness: There is no abdominal tenderness.           Assessment & Plan:  Right sided sciatica - Plan: DG Lumbar Spine Complete  Memory loss  Nausea and vomiting, intractability of vomiting not specified, unspecified vomiting type  Acute pain of both knees  Patient has a difficult time providing history due to confusion.  I am concerned his memory loss is getting worse.  I believe he recently had viral gastroenteritis (although 3 months ago he did have epigastric pain that I believe was due to gastritis).  Therefore I want to see the patient back in 1 week to ensure his symptoms do not return.  He has no longer having symptoms of gastritis but he is having severe knee pain and right-sided sciatica.  Therefore I will start the patient on low-dose meloxicam 7.5 mg daily and see the patient back next week to see how his knees and his stomach are doing.  Hopefully his sciatica will improve and his knee pain will improve and we can stop the medication.  If he continues to have abdominal pain, I would recommend a GI consult given his previous history of colon cancer for an EGD.

## 2020-02-19 ENCOUNTER — Ambulatory Visit (INDEPENDENT_AMBULATORY_CARE_PROVIDER_SITE_OTHER): Payer: Medicare Other | Admitting: Family Medicine

## 2020-02-19 ENCOUNTER — Other Ambulatory Visit: Payer: Self-pay

## 2020-02-19 VITALS — BP 140/80 | HR 71 | Temp 98.2°F | Ht 64.0 in | Wt 169.0 lb

## 2020-02-19 DIAGNOSIS — M25562 Pain in left knee: Secondary | ICD-10-CM

## 2020-02-19 DIAGNOSIS — M25561 Pain in right knee: Secondary | ICD-10-CM

## 2020-02-19 DIAGNOSIS — M5431 Sciatica, right side: Secondary | ICD-10-CM

## 2020-02-19 NOTE — Progress Notes (Signed)
Subjective:    Patient ID: Jose Fisher, male    DOB: August 19, 1955, 64 y.o.   MRN: [id number] 02/12/20 I saw the patient for unintentional weight loss back in the summer and thought he had NSAID induced gastritis.  He states that he is not been taking any other NSAIDs since that time.  However he comes in today seemingly very confused.  He begins by complaining that he wished I would send him somewhere else when he got so sick last time.  However I never saw him for diarrhea.  He states that he had diarrhea.  His wife is with him and states that he is confused.  She has no recollection of this either.  I am not sure as to what he is referring.  He is unable to provide additional history.  He states that Sunday he vomited 1 time.  After that he developed diarrhea 2-3 times a day that subsided on Thursday.  He states that over the last 24 hours he has had no further diarrhea and his stomach is feeling better.  He denies any fevers or chills.  He did have a fever on Sunday but none since that time.  He denies any melena or hematochezia.  He then complains of pain in his lower back radiating into his posterior right hip and down his right leg to his knee.  He also complains of bilateral knee pain.  Tylenol is not helping the pain.  He is not taking any NSAID per his report.  However again he appears confused.  I noticed this at his last visit.  At that time, my plan was: Patient has a difficult time providing history due to confusion.  I am concerned his memory loss is getting worse.  I believe he recently had viral gastroenteritis (although 3 months ago he did have epigastric pain that I believe was due to gastritis).  Therefore I want to see the patient back in 1 week to ensure his symptoms do not return.  He has no longer having symptoms of gastritis but he is having severe knee pain and right-sided sciatica.  Therefore I will start the patient on low-dose meloxicam 7.5 mg daily and see the patient back next  week to see how his knees and his stomach are doing.  Hopefully his sciatica will improve and his knee pain will improve and we can stop the medication.  If he continues to have abdominal pain, I would recommend a GI consult given his previous history of colon cancer for an EGD.  02/19/20 Patient states that the pain in his back is better.  The pain in his knees is some better.  He is taking the meloxicam at night.  During the day he is using Tylenol extra strength.  He can tolerate the pain with this combination.  We had a long discussion today about the risk of NSAIDs.  He denies any stomach upset on the NSAIDs at present.  I warned both him and his wife that meloxicam can irritate his stomach.  I recommended that he take the medication sparingly.  At the present time he is not interested in a cortisone shot in his back or in his knees because the pain has improved  Past Medical History:  Diagnosis Date  . Arthritis   . Colon cancer ([place])   . Family history of colon cancer   . Family history of ovarian cancer   . Lynch syndrome    increased risk of cancers  of renal pelvis, ureter, stomach, small bowel, bile duct, skin (sebaceous neoplasms), and brain (gliomas  . Memory loss    MRI- mild atrophy, microvascular changes, left temporal encephalomalacia (2021) at [facility]  . Personal history of colon cancer 05/13/2017  . Personal history of colonic polyps 05/13/2017  . Wears glasses    Past Surgical History:  Procedure Laterality Date  . APPENDECTOMY    . COLECTOMY  1985   hemi  . COLONOSCOPY    . INGUINAL HERNIA REPAIR     bilat  . SHOULDER ARTHROSCOPY  2011   RCR-DSC-right stayed RCC  . SHOULDER ARTHROSCOPY WITH ROTATOR CUFF REPAIR AND SUBACROMIAL DECOMPRESSION Left 08/21/2012   Procedure: LEFT SHOULDER ARTHROSCOPY SUBACROMIAL DECOMPRESSION, DISTAL CLAVICLE RESECTION AND REPAIR ROTATOR CUFF;  Surgeon: Cammie Sickle., MD;  Location: [facility];  Service: Orthopedics;   Laterality: Left;  . TONSILLECTOMY    . WRIST GANGLION EXCISION  1978   lt   Current Outpatient Medications on File Prior to Visit  Medication Sig Dispense Refill  . aspirin 81 MG tablet Take 81 mg by mouth daily.    Marland Kitchen BETAINE PO Take by mouth.    . Cholecalciferol (VITAMIN D3) 5000 units CAPS Take by mouth.    Marland Kitchen CINNAMON PO Take by mouth.    . Echinacea 125 MG TABS Take by mouth.    . ferrous sulfate 325 (65 FE) MG tablet Take 325 mg by mouth daily with breakfast.    . Flaxseed, Linseed, (FLAX SEED OIL PO) Take 1,200 mg by mouth daily.     . fluticasone (FLONASE) 50 MCG/ACT nasal spray Place 2 sprays into both nostrils daily. (Patient taking differently: Place 2 sprays into both nostrils daily as needed. ) 16 g 6  . Garlic 950 MG TABS Take by mouth.    . Ginkgo Biloba 100 MG CAPS Take by mouth.    . Glucosamine-Chondroitin (MOVE FREE PO) Take by mouth.    . meloxicam (MOBIC) 7.5 MG tablet Take 1 tablet (7.5 mg total) by mouth daily as needed for pain. 30 tablet 1  . Misc Natural Products (SUPER ENERGY PO) Take by mouth.    . Multiple Vitamins-Minerals (MULTIVITAMIN WITH MINERALS) tablet Take 1 tablet by mouth daily.    Marland Kitchen omeprazole (PRILOSEC) 40 MG capsule TAKE 1 CAPSULE(40 MG) BY MOUTH DAILY 90 capsule 1  . OVER THE COUNTER MEDICATION Prostene TID    . OVER THE COUNTER MEDICATION Ubinquinal 100mg  1 daily    . PSYLLIUM PO Take by mouth.    Marland Kitchen tiZANidine (ZANAFLEX) 4 MG tablet TAKE 1 TABLET(4 MG) BY MOUTH EVERY 6 HOURS AS NEEDED FOR MUSCLE SPASMS 30 tablet 0  . TURMERIC CURCUMIN PO Take by mouth.    . vitamin C (ASCORBIC ACID) 500 MG tablet Take 500 mg by mouth daily.    . vitamin E (VITAMIN E) 400 UNIT capsule Take 400 Units by mouth once a week.    . vitamin k 100 MCG tablet Take 100 mcg by mouth daily.     No current facility-administered medications on file prior to visit.   No Known Allergies Social History   Socioeconomic History  . Marital status: Married    Spouse name:  Not on file  . Number of children: Not on file  . Years of education: Not on file  . Highest education level: Not on file  Occupational History  . Not on file  Tobacco Use  . Smoking status: Never Smoker  .  Smokeless tobacco: Never Used  Substance and Sexual Activity  . Alcohol use: Yes    Comment: occ  . Drug use: No  . Sexual activity: Not on file  Other Topics Concern  . Not on file  Social History Narrative  . Not on file   Social Determinants of Health   Financial Resource Strain:   . Difficulty of Paying Living Expenses: Not on file  Food Insecurity:   . Worried About Charity fundraiser in the Last Year: Not on file  . Ran Out of Food in the Last Year: Not on file  Transportation Needs:   . Lack of Transportation (Medical): Not on file  . Lack of Transportation (Non-Medical): Not on file  Physical Activity:   . Days of Exercise per Week: Not on file  . Minutes of Exercise per Session: Not on file  Stress:   . Feeling of Stress : Not on file  Social Connections:   . Frequency of Communication with Friends and Family: Not on file  . Frequency of Social Gatherings with Friends and Family: Not on file  . Attends Religious Services: Not on file  . Active Member of Clubs or Organizations: Not on file  . Attends Archivist Meetings: Not on file  . Marital Status: Not on file  Intimate Partner Violence:   . Fear of Current or Ex-Partner: Not on file  . Emotionally Abused: Not on file  . Physically Abused: Not on file  . Sexually Abused: Not on file      Review of Systems  All other systems reviewed and are negative.      Objective:   Physical Exam Vitals reviewed.  Cardiovascular:     Rate and Rhythm: Normal rate and regular rhythm.  Pulmonary:     Effort: Pulmonary effort is normal.     Breath sounds: Normal breath sounds.  Abdominal:     General: Abdomen is flat. Bowel sounds are normal. There is no distension. There are no signs of injury.       Palpations: Abdomen is soft. There is no hepatomegaly or mass.     Tenderness: There is no abdominal tenderness.  Musculoskeletal:     Lumbar back: No spasms, tenderness or bony tenderness. Decreased range of motion.     Right knee: Normal range of motion.     Left knee: Normal range of motion.           Assessment & Plan:  Right sided sciatica  Acute pain of both knees  Patient has osteoarthritis in both knees and he has right-sided sciatica stemming from degenerative disc disease in his lumbar spine.  At the present time his symptoms are well controlled using Tylenol and then sparingly using meloxicam.  Continue to use any NSAIDs sparingly.  Tylenol is safe to take on a regular basis for this patient.  Recommended knee braces at work.  Could return for cortisone injections in his knees if the pain worsens.  Discontinue the meloxicam and use it only as needed and use it sparingly

## 2020-09-09 ENCOUNTER — Encounter: Payer: Self-pay | Admitting: Family Medicine

## 2020-09-09 ENCOUNTER — Other Ambulatory Visit: Payer: Self-pay

## 2020-09-09 ENCOUNTER — Ambulatory Visit (INDEPENDENT_AMBULATORY_CARE_PROVIDER_SITE_OTHER): Payer: Medicare Other | Admitting: Family Medicine

## 2020-09-09 ENCOUNTER — Telehealth: Payer: Self-pay | Admitting: Family Medicine

## 2020-09-09 VITALS — BP 138/84 | HR 68 | Temp 98.3°F | Resp 14 | Ht 64.0 in | Wt 171.0 lb

## 2020-09-09 DIAGNOSIS — R413 Other amnesia: Secondary | ICD-10-CM

## 2020-09-09 DIAGNOSIS — Z136 Encounter for screening for cardiovascular disorders: Secondary | ICD-10-CM | POA: Diagnosis not present

## 2020-09-09 DIAGNOSIS — Z0001 Encounter for general adult medical examination with abnormal findings: Secondary | ICD-10-CM | POA: Diagnosis not present

## 2020-09-09 DIAGNOSIS — Z1509 Genetic susceptibility to other malignant neoplasm: Secondary | ICD-10-CM

## 2020-09-09 DIAGNOSIS — Z85038 Personal history of other malignant neoplasm of large intestine: Secondary | ICD-10-CM

## 2020-09-09 DIAGNOSIS — Z125 Encounter for screening for malignant neoplasm of prostate: Secondary | ICD-10-CM

## 2020-09-09 DIAGNOSIS — Z0000 Encounter for general adult medical examination without abnormal findings: Secondary | ICD-10-CM

## 2020-09-09 MED ORDER — MELOXICAM 15 MG PO TABS: 15.0000 mg | ORAL_TABLET | Freq: Every day | ORAL | 3 refills | Status: DC
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
## 2020-09-09 NOTE — Telephone Encounter (Signed)
Patient has restrictions with incoming calls on landline. Patient requesting to be contacted on mobile number of [phone] to schedule MRI.

## 2020-09-10 LAB — CBC WITH DIFFERENTIAL/PLATELET
Absolute Monocytes: 339 cells/uL (ref 200–950)
Basophils Absolute: 42 cells/uL (ref 0–200)
Basophils Relative: 0.8 %
Eosinophils Absolute: 133 cells/uL (ref 15–500)
Eosinophils Relative: 2.5 %
HCT: 49 % (ref 38.5–50.0)
Hemoglobin: 16.6 g/dL (ref 13.2–17.1)
Lymphs Abs: 2364 cells/uL (ref 850–3900)
MCH: 30.9 pg (ref 27.0–33.0)
MCHC: 33.9 g/dL (ref 32.0–36.0)
MCV: 91.2 fL (ref 80.0–100.0)
MPV: 11 fL (ref 7.5–12.5)
Monocytes Relative: 6.4 %
Neutro Abs: 2422 cells/uL (ref 1500–7800)
Neutrophils Relative %: 45.7 %
Platelets: 205 10*3/uL (ref 140–400)
RBC: 5.37 10*6/uL (ref 4.20–5.80)
RDW: 12.6 % (ref 11.0–15.0)
Total Lymphocyte: 44.6 %
WBC: 5.3 10*3/uL (ref 3.8–10.8)

## 2020-09-10 LAB — COMPLETE METABOLIC PANEL WITH GFR
AG Ratio: 2 (calc) (ref 1.0–2.5)
ALT: 18 U/L (ref 9–46)
AST: 21 U/L (ref 10–35)
Albumin: 4.7 g/dL (ref 3.6–5.1)
Alkaline phosphatase (APISO): 45 U/L (ref 35–144)
BUN: 12 mg/dL (ref 7–25)
CO2: 30 mmol/L (ref 20–32)
Calcium: 9.8 mg/dL (ref 8.6–10.3)
Chloride: 101 mmol/L (ref 98–110)
Creat: 0.8 mg/dL (ref 0.70–1.25)
GFR, Est African American: 108 mL/min/{1.73_m2} (ref >=60)
GFR, Est Non African American: 93 mL/min/{1.73_m2} (ref >=60)
Globulin: 2.4 g/dL (calc) (ref 1.9–3.7)
Glucose, Bld: 88 mg/dL (ref 65–99)
Potassium: 4.2 mmol/L (ref 3.5–5.3)
Sodium: 139 mmol/L (ref 135–146)
Total Bilirubin: 0.7 mg/dL (ref 0.2–1.2)
Total Protein: 7.1 g/dL (ref 6.1–8.1)

## 2020-09-10 LAB — LIPID PANEL
Cholesterol: 219 mg/dL — ABNORMAL HIGH (ref <200)
HDL: 67 mg/dL (ref >=40)
LDL Cholesterol (Calc): 122 mg/dL (calc) — ABNORMAL HIGH
Non-HDL Cholesterol (Calc): 152 mg/dL (calc) — ABNORMAL HIGH (ref <130)
Total CHOL/HDL Ratio: 3.3 (calc) (ref <5.0)
Triglycerides: 178 mg/dL — ABNORMAL HIGH (ref <150)

## 2020-09-10 LAB — TSH: TSH: 1.08 mIU/L (ref 0.40–4.50)

## 2020-09-10 LAB — PSA: PSA: 0.66 ng/mL (ref <=4.00)

## 2020-09-10 LAB — VITAMIN B12: Vitamin B-12: 791 pg/mL (ref 200–1100)

## 2020-09-16 ENCOUNTER — Ambulatory Visit
Admission: RE | Admit: 2020-09-16 | Discharge: 2020-09-16 | Disposition: A | Discharge status: Home / Self Care | Payer: Medicare Other | Source: Ambulatory Visit | Attending: Family Medicine | Admitting: Family Medicine

## 2020-09-16 ENCOUNTER — Other Ambulatory Visit: Payer: Self-pay

## 2020-09-16 DIAGNOSIS — R413 Other amnesia: Secondary | ICD-10-CM

## 2020-09-16 DIAGNOSIS — J322 Chronic ethmoidal sinusitis: Secondary | ICD-10-CM | POA: Diagnosis not present

## 2020-09-16 DIAGNOSIS — G319 Degenerative disease of nervous system, unspecified: Secondary | ICD-10-CM | POA: Diagnosis not present

## 2020-09-16 DIAGNOSIS — I6782 Cerebral ischemia: Secondary | ICD-10-CM | POA: Diagnosis not present

## 2020-10-04 ENCOUNTER — Telehealth: Payer: Self-pay | Admitting: Family Medicine

## 2020-10-04 NOTE — Telephone Encounter (Signed)
Multiple calls placed to patient with no answer and no return call.   Message to be closed.  

## 2020-10-04 NOTE — Telephone Encounter (Signed)
Pt came in wanting to know about his results from his recent MRI. He has asked if someone would please give him call back.   Cb#: [phone]

## 2020-10-11 ENCOUNTER — Telehealth: Payer: Self-pay | Admitting: Family Medicine

## 2020-10-11 NOTE — Telephone Encounter (Signed)
Patient called to follow up on previous converstation with provider about taking a new medication. Patient stated something was supposed to be sent to pharmacy but doesn't remember name of medication..  Patient also wants to know if a follow up appointment is needed.  Please advise at [phone], or [phone].

## 2020-10-11 NOTE — Telephone Encounter (Signed)
Labs look excellent except for mild elevation in his cholesterol. Otherwise, B12, thyroid, blood sugar, liver test are all normal.  MRI shows atrophy of the brain. No acute abnormalities are seen. I believe this is most likely consistent with Alzheimer's disease as we discussed. Would recommend starting Aricept and increasing to 10 mg a day in 1 month if tolerated  Call placed to patient. Patient land line restricted and is not accepting calls at this time. Mobile number has no VM set up.

## 2020-10-12 NOTE — Telephone Encounter (Signed)
Call placed to patient. Patient land line restricted and is not accepting calls at this time. Mobile number has no VM set up.

## 2020-10-14 ENCOUNTER — Other Ambulatory Visit: Payer: Self-pay | Admitting: Family Medicine

## 2020-10-14 MED ORDER — DONEPEZIL HCL 10 MG PO TABS: 10.0000 mg | ORAL_TABLET | Freq: Every day | ORAL | 1 refills | Status: DC

## 2020-10-14 MED ORDER — DONEPEZIL HCL 5 MG PO TABS: 5.0000 mg | ORAL_TABLET | Freq: Every day | ORAL | 0 refills | Status: DC

## 2020-10-14 NOTE — Telephone Encounter (Signed)
Patient came to office to discuss imaging report that was mailed to him.   Agreeable to Aricept.   Prescription sent to pharmacy. Appointment scheduled for 2 months to F/U.

## 2020-12-16 ENCOUNTER — Ambulatory Visit: Payer: TRICARE For Life (TFL) | Admitting: Family Medicine

## 2021-01-20 ENCOUNTER — Ambulatory Visit (INDEPENDENT_AMBULATORY_CARE_PROVIDER_SITE_OTHER): Payer: Medicare Other | Admitting: Family Medicine

## 2021-01-20 ENCOUNTER — Other Ambulatory Visit: Payer: Self-pay | Admitting: Family Medicine

## 2021-01-20 ENCOUNTER — Other Ambulatory Visit: Payer: Self-pay

## 2021-01-20 ENCOUNTER — Encounter: Payer: Self-pay | Admitting: Family Medicine

## 2021-01-20 VITALS — BP 144/80 | HR 82 | Temp 100.6°F | Resp 16 | Ht 64.0 in | Wt 170.0 lb

## 2021-01-20 DIAGNOSIS — S46212A Strain of muscle, fascia and tendon of other parts of biceps, left arm, initial encounter: Secondary | ICD-10-CM

## 2021-01-20 DIAGNOSIS — J069 Acute upper respiratory infection, unspecified: Secondary | ICD-10-CM

## 2021-01-20 MED ORDER — DONEPEZIL HCL 10 MG PO TABS: 10.0000 mg | ORAL_TABLET | Freq: Every day | ORAL | 5 refills | Status: DC

## 2021-01-20 MED ORDER — LEVOCETIRIZINE DIHYDROCHLORIDE 5 MG PO TABS: 5.0000 mg | ORAL_TABLET | Freq: Every evening | ORAL | 0 refills | Status: DC

## 2021-01-20 MED ORDER — DICLOFENAC SODIUM 1 % EX GEL: 2.0000 g | Freq: Four times a day (QID) | CUTANEOUS | 0 refills | Status: DC

## 2021-01-20 MED ORDER — FLUTICASONE PROPIONATE 50 MCG/ACT NA SUSP: 2.0000 | Freq: Every day | NASAL | 6 refills | Status: DC

## 2021-01-20 NOTE — Progress Notes (Signed)
Subjective:    Patient ID: Jose Fisher, male    DOB: 04/17/1956, 65 y.o.   MRN: [id number]  HPI History is very difficult due to patient's memory loss.  He is alone today.  However, patient reports a 5-week history of runny nose and postnasal drip and nonproductive cough.  He took 3 COVID test in the last 3 days.  One was faintly positive however he may have damaged the test and performing.  The other 2 that he took directly after that on Wednesday and again on Thursday were both negative.  He denies any chest pain.  He denies any fever.  He denies any shortness of breath.  His exam today is normal with no evidence of congestion or sinus pain or chest congestion or wheezing.  Patient does have a low-grade fever to 100.3.  Earlier this week he has injured his left bicep.  He was lifting a heavy trash bag out of a trash can by flexing his biceps and pulling upward on the trash bag.  It was extremely heavy.  Now on the tendinous insertion of the biceps on his forearm, he is extremely tender to palpation.  There is no palpable deformity in the left bicep.  There is no obvious rupture of the biceps tendon.  There is no erythema or swelling however palpation on the distal biceps tendon elicits tenderness and pain.  I believe that he has tendinitis and likely strained the tendon lifting the heavy trash bag   Past Medical History:  Diagnosis Date  . Arthritis   . Colon cancer ([place])   . Family history of colon cancer   . Family history of ovarian cancer   . Lynch syndrome    increased risk of cancers of renal pelvis, ureter, stomach, small bowel, bile duct, skin (sebaceous neoplasms), and brain (gliomas  . Memory loss    MRI- mild atrophy, microvascular changes, left temporal encephalomalacia (2021) at [facility]  . Personal history of colon cancer 05/13/2017  . Personal history of colonic polyps 05/13/2017  . Wears glasses    Past Surgical History:  Procedure Laterality Date  . APPENDECTOMY    .  COLECTOMY  1985   hemi  . COLONOSCOPY    . INGUINAL HERNIA REPAIR     bilat  . SHOULDER ARTHROSCOPY  2011   [facility]-D[facility]-right stayed [facility]  . SHOULDER ARTHROSCOPY WITH ROTATOR CUFF REPAIR AND SUBACROMIAL DECOMPRESSION Left 08/21/2012   Procedure: LEFT SHOULDER ARTHROSCOPY SUBACROMIAL DECOMPRESSION, DISTAL CLAVICLE RESECTION AND REPAIR ROTATOR CUFF;  Surgeon: Cammie Sickle., MD;  Location: [facility];  Service: Orthopedics;  Laterality: Left;  . TONSILLECTOMY    . WRIST GANGLION EXCISION  1978   lt   Current Outpatient Medications on File Prior to Visit  Medication Sig Dispense Refill  . aspirin 81 MG tablet Take 81 mg by mouth daily.    . Cholecalciferol (VITAMIN D3) 5000 units CAPS Take by mouth.    Marland Kitchen CINNAMON PO Take by mouth.    . donepezil (ARICEPT) 10 MG tablet Take 1 tablet (10 mg total) by mouth at bedtime. Begin after taking Aricpet '5mg'$  x 30days. 90 tablet 1  . donepezil (ARICEPT) 5 MG tablet Take 1 tablet (5 mg total) by mouth at bedtime. 30 tablet 0  . Echinacea 125 MG TABS Take by mouth.    . ferrous sulfate 325 (65 FE) MG tablet Take 325 mg by mouth daily with breakfast.    . Flaxseed, Linseed, (FLAX SEED  OIL PO) Take 1,200 mg by mouth daily.     . fluticasone (FLONASE) 50 MCG/ACT nasal spray Place 2 sprays into both nostrils daily. (Patient taking differently: Place 2 sprays into both nostrils daily as needed.) 16 g 6  . Garlic 123XX123 MG TABS Take by mouth.    . Ginkgo Biloba 100 MG CAPS Take by mouth.    . Glucosamine-Chondroitin (MOVE FREE PO) Take by mouth.    . meloxicam (MOBIC) 15 MG tablet Take 1 tablet (15 mg total) by mouth daily. 30 tablet 3  . meloxicam (MOBIC) 7.5 MG tablet Take 1 tablet (7.5 mg total) by mouth daily as needed for pain. 30 tablet 1  . Multiple Vitamins-Minerals (MULTIVITAMIN WITH MINERALS) tablet Take 1 tablet by mouth daily.    Marland Kitchen omeprazole (PRILOSEC) 40 MG capsule TAKE 1 CAPSULE(40 MG) BY MOUTH DAILY 90 capsule 1  . OVER THE COUNTER  MEDICATION Prostene TID    . OVER THE COUNTER MEDICATION Ubinquinal '100mg'$  1 daily    . tiZANidine (ZANAFLEX) 4 MG tablet TAKE 1 TABLET(4 MG) BY MOUTH EVERY 6 HOURS AS NEEDED FOR MUSCLE SPASMS 30 tablet 0  . TURMERIC CURCUMIN PO Take by mouth.    . vitamin C (ASCORBIC ACID) 500 MG tablet Take 500 mg by mouth daily.    . vitamin E 180 MG (400 UNITS) capsule Take 400 Units by mouth once a week.    . vitamin k 100 MCG tablet Take 100 mcg by mouth daily.     No current facility-administered medications on file prior to visit.   No Known Allergies Social History   Socioeconomic History  . Marital status: Married    Spouse name: Not on file  . Number of children: Not on file  . Years of education: Not on file  . Highest education level: Not on file  Occupational History  . Not on file  Tobacco Use  . Smoking status: Never  . Smokeless tobacco: Never  Substance and Sexual Activity  . Alcohol use: Yes    Comment: occ  . Drug use: No  . Sexual activity: Not on file  Other Topics Concern  . Not on file  Social History Narrative  . Not on file   Social Determinants of Health   Financial Resource Strain: Not on file  Food Insecurity: Not on file  Transportation Needs: Not on file  Physical Activity: Not on file  Stress: Not on file  Social Connections: Not on file  Intimate Partner Violence: Not on file   Family History  Problem Relation Age of Onset  . Colon cancer Mother 9       says she also had 'intestinal cancer' in records  . Ovarian cancer Mother 68  . Liver cancer Mother   . Heart disease Father   . Stroke Father 46  . Colon polyps Son 79  . Heart disease Paternal Grandfather       Review of Systems  All other systems reviewed and are negative.     Objective:   Physical Exam Vitals reviewed.  Constitutional:      General: He is not in acute distress.    Appearance: He is well-developed. He is not diaphoretic.  HENT:     Head: Normocephalic and  atraumatic.     Right Ear: External ear normal.     Left Ear: External ear normal.     Nose: Nose normal.     Mouth/Throat:     Pharynx: Oropharynx is clear.  No oropharyngeal exudate.  Neck:     Thyroid: No thyromegaly.     Vascular: No JVD.     Trachea: No tracheal deviation.  Cardiovascular:     Rate and Rhythm: Normal rate and regular rhythm.     Heart sounds: Normal heart sounds. No murmur heard.   No friction rub. No gallop.  Pulmonary:     Effort: Pulmonary effort is normal. No respiratory distress.     Breath sounds: Normal breath sounds. No stridor. No wheezing or rales.  Chest:     Chest wall: No tenderness.  Abdominal:     General: Bowel sounds are normal. There is no distension.     Palpations: Abdomen is soft. There is no mass.     Tenderness: There is no abdominal tenderness. There is no guarding or rebound.  Genitourinary:    Penis: No tenderness.   Musculoskeletal:     Left upper arm: Tenderness present.     Left elbow: No deformity, effusion or lacerations. Normal range of motion. Tenderness present.       Arms:  Lymphadenopathy:     Cervical: No cervical adenopathy.  Neurological:     Mental Status: He is alert.     Motor: No abnormal muscle tone.     Deep Tendon Reflexes: Reflexes are normal and symmetric.          Assessment & Plan:  Viral upper respiratory tract infection  Strain of left biceps, initial encounter Patient has a low-grade fever here today.  He has taken two consecutive COVID test on Wednesday and Thursday that were negative.  His exam today is benign.  There is no evidence of any obvious abnormality on his exam.  Symptoms seem to have been present now for several weeks although the history is difficult to obtain due to his dementia.  However I do not see an indication to prescribe paxlovid.  Patient may have a sinus infection however his exam today is relatively benign.  Therefore I recommended that we try Flonase and Xyzal in an effort  to dry up the congestion and runny nose over the weekend.  Then reassess next week.  If it is worsening, consider treatment for a sinus infection.  Patient was more concerned about the pain in his left antecubital fossa.  I believe that he strained his left biceps.  I have recommended rest.  This may take 3 to 4 weeks to improve.  He can apply topical Voltaren gel 4 times a day to the affected area to relieve inflammation.

## 2021-02-10 ENCOUNTER — Telehealth: Payer: Self-pay

## 2021-02-10 NOTE — Telephone Encounter (Signed)
Pt's spouse called in requesting a form or letter stating that the dr has seen this pt and the pt is cognitive to understand what is going on. Pt's spouse would like to update their power of attorneys and living wills. Please contact Mrs. Moten with any questions please.  Call [phone]

## 2021-02-13 ENCOUNTER — Encounter: Payer: Self-pay | Admitting: Family Medicine

## 2021-02-14 NOTE — Telephone Encounter (Signed)
Letter has been drafted and signed by provider. Outbound calls placed to [phone] and [phone]. Unable to reach patient's spouse Santiago Glad; no voicemail.  Letter is at the front desk.

## 2021-02-17 ENCOUNTER — Telehealth: Payer: Self-pay | Admitting: Family Medicine

## 2021-02-17 MED ORDER — DICLOFENAC SODIUM 1 % EX GEL: 2.0000 g | Freq: Four times a day (QID) | CUTANEOUS | 0 refills | Status: DC

## 2021-02-17 NOTE — Telephone Encounter (Signed)
Patient called to follow up on issue with left arm. Wants to know if he needs a refill of diclofenac Sodium (VOLTAREN) 1 % GEL [[id number]]  or if he should come in for an appointment first to be reevaluated.   Please advise at [phone] or [phone].

## 2021-02-17 NOTE — Telephone Encounter (Signed)
Prescription sent to pharmacy.   Call placed to patient. No answer. No VM.   If patient continues to have pain, we can certainly see him for re-evaluation.

## 2021-04-24 ENCOUNTER — Telehealth: Payer: Self-pay | Admitting: Family Medicine

## 2021-04-24 NOTE — Telephone Encounter (Signed)
Please advise, thanks.

## 2021-04-24 NOTE — Telephone Encounter (Signed)
Patient called to request referral to specialist; having ongoing issues with short-term and long-term memory and recall of vocabulary words to articulate and communicate with others. Unsure if another appointment needed or if referral can be sent since seen by provider recently.   Please advise at [phone].

## 2021-04-25 ENCOUNTER — Other Ambulatory Visit: Payer: Self-pay | Admitting: Family Medicine

## 2021-04-25 DIAGNOSIS — F03B0 Unspecified dementia, moderate, without behavioral disturbance, psychotic disturbance, mood disturbance, and anxiety: Secondary | ICD-10-CM

## 2021-04-26 NOTE — Telephone Encounter (Signed)
Spoke with pt's wife and reviewed referral to Neuro. She also had questions about pt's current meds, those were all reviewed. Nothing further needed.

## 2021-04-27 ENCOUNTER — Encounter: Payer: Self-pay | Admitting: Family Medicine

## 2021-05-09 DIAGNOSIS — C189 Malignant neoplasm of colon, unspecified: Secondary | ICD-10-CM | POA: Insufficient documentation

## 2021-06-05 ENCOUNTER — Encounter: Payer: Self-pay | Admitting: Neurology

## 2021-06-05 ENCOUNTER — Ambulatory Visit (INDEPENDENT_AMBULATORY_CARE_PROVIDER_SITE_OTHER): Payer: Medicare Other | Admitting: Neurology

## 2021-06-05 VITALS — BP 172/89 | HR 58 | Ht 64.0 in | Wt 173.5 lb

## 2021-06-05 DIAGNOSIS — G309 Alzheimer's disease, unspecified: Secondary | ICD-10-CM | POA: Diagnosis not present

## 2021-06-05 DIAGNOSIS — F02A0 Dementia in other diseases classified elsewhere, mild, without behavioral disturbance, psychotic disturbance, mood disturbance, and anxiety: Secondary | ICD-10-CM | POA: Diagnosis not present

## 2021-06-05 MED ORDER — MEMANTINE HCL 10 MG PO TABS
10.0000 mg | ORAL_TABLET | Freq: Two times a day (BID) | ORAL | 11 refills | Status: DC
Start: 2021-06-05 — End: 2021-09-12
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
## 2021-06-05 NOTE — Patient Instructions (Addendum)
Continue with Aricept 10mg  night  Start Namenda 10 mg daily for one week and increase to 10 mg twice daily  Routine EEG  Follow up with your PCP  Return in 1 year     There are well-accepted and sensible ways to reduce risk for Alzheimers disease and other degenerative brain disorders .  Exercise Daily Walk A daily 20 minute walk should be part of your routine. Disease related apathy can be a significant roadblock to exercise and the only way to overcome this is to make it a daily routine and perhaps have a reward at the end (something your loved one loves to eat or drink perhaps) or a personal trainer coming to the home can also be very useful. Most importantly, the patient is much more likely to exercise if the caregiver / spouse does it with him/her. In general a structured, repetitive schedule is best.  General Health: Any diseases which effect your body will effect your brain such as a pneumonia, urinary infection, blood clot, heart attack or stroke. Keep contact with your primary care doctor for regular follow ups.  Sleep. A good nights sleep is healthy for the brain. Seven hours is recommended. If you have insomnia or poor sleep habits we can give you some instructions. If you have sleep apnea wear your mask.  Diet: Eating a heart healthy diet is also a good idea; fish and poultry instead of red meat, nuts (mostly non-peanuts), vegetables, fruits, olive oil or canola oil (instead of butter), minimal salt (use other spices to flavor foods), whole grain rice, bread, cereal and pasta and wine in moderation.Research is now showing that the MIND diet, which is a combination of The Mediterranean diet and the DASH diet, is beneficial for cognitive processing and longevity. Information about this diet can be found in The MIND Diet, a book by Doyne Keel, MS, RDN, and online at [employer]book[employer].si  Finances, Power of Attorney and Advance Directives: You should consider  putting legal safeguards in place with regard to financial and medical decision making. While the spouse always has power of attorney for medical and financial issues in the absence of any form, you should consider what you want in case the spouse / caregiver is no longer around or capable of making decisions.     Heart-head connection  New research shows there are things we can do to reduce the risk of mild cognitive impairment and dementia.  Several conditions known to increase the risk of cardiovascular disease -- such as high blood pressure, diabetes and high cholesterol -- also increase the risk of developing Alzheimer's. Some autopsy studies show that as many as 25 percent of individuals with Alzheimer's disease also have cardiovascular disease.  A longstanding question is why some people develop hallmark Alzheimer's plaques and tangles but do not develop the symptoms of Alzheimer's. Vascular disease may help researchers eventually find an answer. Some autopsy studies suggest that plaques and tangles may be present in the brain without causing symptoms of cognitive decline unless the brain also shows evidence of vascular disease. More research is needed to better understand the link between vascular health and Alzheimer's.  Physical exercise and diet Regular physical exercise may be a beneficial strategy to lower the risk of Alzheimer's and vascular dementia. Exercise may directly benefit brain cells by increasing blood and oxygen flow in the brain. Because of its known cardiovascular benefits, a medically approved exercise program is a valuable part of any overall wellness plan.  Current evidence suggests  that heart-healthy eating may also help protect the brain. Heart-healthy eating includes limiting the intake of sugar and saturated fats and making sure to eat plenty of fruits, vegetables, and whole grains. No one diet is best. Two diets that have been studied and may be beneficial are the  DASH (Dietary Approaches to Stop Hypertension) diet and the Mediterranean diet. The DASH diet emphasizes vegetables, fruits and fat-free or low-fat dairy products; includes whole grains, fish, poultry, beans, seeds, nuts and vegetable oils; and limits sodium, sweets, sugary beverages and red meats. A Mediterranean diet includes relatively little red meat and emphasizes whole grains, fruits and vegetables, fish and shellfish, and nuts, olive oil and other healthy fats.  Social connections and intellectual activity A number of studies indicate that maintaining strong social connections and keeping mentally active as we age might lower the risk of cognitive decline and Alzheimer's. Experts are not certain about the reason for this association. It may be due to direct mechanisms through which social and mental stimulation strengthen connections between nerve cells in the brain.  Head trauma There appears to be a strong link between future risk of Alzheimer's and serious head trauma, especially when injury involves loss of consciousness. You can help reduce your risk of Alzheimer's by protecting your head.  Wear a seat belt  Use a helmet when participating in sports  "Fall-proof" your home   What you can do now While research is not yet conclusive, certain lifestyle choices, such as physical activity and diet, may help support brain health and prevent Alzheimer's. Many of these lifestyle changes have been shown to lower the risk of other diseases, like heart disease and diabetes, which have been linked to Alzheimer's. With few drawbacks and plenty of known benefits, healthy lifestyle choices can improve your health and possibly protect your brain.  Learn more about brain health. You can help increase our knowledge by considering participation in a clinical study. Our free clinical trial matching services, TrialMatch, can help you find clinical trials in your area that are seeking volunteers.

## 2021-06-06 ENCOUNTER — Telehealth: Payer: Self-pay | Admitting: Neurology

## 2021-06-06 NOTE — Telephone Encounter (Signed)
Pt would like a call from the nurse to discuss if need to keep EEG appt for tomorrow. Had a problem with prescription for Memantine 10 mg yesterday. Just picked up the pills today. I wanted to know if I need to reschedule EEG because have not started taking the medication.

## 2021-06-06 NOTE — Telephone Encounter (Signed)
I called patient.  I advised him that he can keep his EEG appointment tomorrow even though he has not started his memantine.  Patient will proceed with his EEG appointment as scheduled.

## 2021-06-07 ENCOUNTER — Ambulatory Visit (INDEPENDENT_AMBULATORY_CARE_PROVIDER_SITE_OTHER): Payer: Medicare Other | Admitting: Neurology

## 2021-06-07 DIAGNOSIS — G309 Alzheimer's disease, unspecified: Secondary | ICD-10-CM

## 2021-06-07 DIAGNOSIS — R410 Disorientation, unspecified: Secondary | ICD-10-CM | POA: Diagnosis not present

## 2021-06-09 NOTE — Procedures (Signed)
° ° °  History: 66 year old man with dementia and irritability   EEG classification:  Awake and asleep  Description of the recording: The background rhythms of this recording consists of a fairly well modulated medium amplitude background activity of 9 Hz. As the record progresses, the patient initially is in the waking state, but appears to enter the early stage II sleep during the recording, with rudimentary sleep spindles and vertex sharp wave activity seen. During the wakeful state, photic stimulation is performed, and no abnormal responses were seen. Hyperventilation was also performed, no abnormal response seen. No epileptiform discharges seen during this recording. There was no focal slowing. EKG monitor shows no evidence of cardiac rhythm abnormalities with a heart rate of 60.  Impression: This is a normal EEG recording in the waking and sleeping state. No evidence interictal epileptiform discharges were seen at any time during the recording.  A normal EEG does not exclude a diagnosis of epilepsy.    Alric Ran, MD [facility]

## 2021-08-10 DIAGNOSIS — H17821 Peripheral opacity of cornea, right eye: Secondary | ICD-10-CM | POA: Diagnosis not present

## 2021-08-10 DIAGNOSIS — H0102A Squamous blepharitis right eye, upper and lower eyelids: Secondary | ICD-10-CM | POA: Diagnosis not present

## 2021-08-10 DIAGNOSIS — H2513 Age-related nuclear cataract, bilateral: Secondary | ICD-10-CM | POA: Diagnosis not present

## 2021-08-10 DIAGNOSIS — H1045 Other chronic allergic conjunctivitis: Secondary | ICD-10-CM | POA: Diagnosis not present

## 2021-08-10 DIAGNOSIS — H0102B Squamous blepharitis left eye, upper and lower eyelids: Secondary | ICD-10-CM | POA: Diagnosis not present

## 2021-08-29 ENCOUNTER — Ambulatory Visit: Payer: TRICARE For Life (TFL) | Admitting: Family Medicine

## 2021-09-12 ENCOUNTER — Encounter: Payer: Self-pay | Admitting: Family Medicine

## 2021-09-12 ENCOUNTER — Ambulatory Visit (INDEPENDENT_AMBULATORY_CARE_PROVIDER_SITE_OTHER): Payer: TRICARE For Life (TFL) | Admitting: Family Medicine

## 2021-09-12 VITALS — BP 150/92 | HR 52 | Temp 97.9°F | Ht 64.0 in | Wt 169.4 lb

## 2021-09-12 DIAGNOSIS — Z1509 Genetic susceptibility to other malignant neoplasm: Secondary | ICD-10-CM | POA: Diagnosis not present

## 2021-09-12 DIAGNOSIS — Z23 Encounter for immunization: Secondary | ICD-10-CM | POA: Diagnosis not present

## 2021-09-12 DIAGNOSIS — Z0000 Encounter for general adult medical examination without abnormal findings: Secondary | ICD-10-CM

## 2021-09-12 DIAGNOSIS — Z85038 Personal history of other malignant neoplasm of large intestine: Secondary | ICD-10-CM | POA: Diagnosis not present

## 2021-09-12 DIAGNOSIS — Z125 Encounter for screening for malignant neoplasm of prostate: Secondary | ICD-10-CM | POA: Diagnosis not present

## 2021-09-12 DIAGNOSIS — F03B0 Unspecified dementia, moderate, without behavioral disturbance, psychotic disturbance, mood disturbance, and anxiety: Secondary | ICD-10-CM | POA: Diagnosis not present

## 2021-09-12 DIAGNOSIS — Z136 Encounter for screening for cardiovascular disorders: Secondary | ICD-10-CM | POA: Diagnosis not present
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
## 2021-09-12 NOTE — Progress Notes (Signed)
Prevnar 20 given, L-deltoid. Pt tol well ?

## 2021-09-13 LAB — COMPLETE METABOLIC PANEL WITH GFR
AG Ratio: 1.8 (calc) (ref 1.0–2.5)
ALT: 49 U/L — ABNORMAL HIGH (ref 9–46)
AST: 26 U/L (ref 10–35)
Albumin: 4.5 g/dL (ref 3.6–5.1)
Alkaline phosphatase (APISO): 75 U/L (ref 35–144)
BUN: 13 mg/dL (ref 7–25)
CO2: 30 mmol/L (ref 20–32)
Calcium: 10.4 mg/dL — ABNORMAL HIGH (ref 8.6–10.3)
Chloride: 102 mmol/L (ref 98–110)
Creat: 0.83 mg/dL (ref 0.70–1.35)
Globulin: 2.5 g/dL (calc) (ref 1.9–3.7)
Glucose, Bld: 86 mg/dL (ref 65–99)
Potassium: 4.7 mmol/L (ref 3.5–5.3)
Sodium: 141 mmol/L (ref 135–146)
Total Bilirubin: 0.8 mg/dL (ref 0.2–1.2)
Total Protein: 7 g/dL (ref 6.1–8.1)
eGFR: 96 mL/min/{1.73_m2} (ref >=60)

## 2021-09-13 LAB — CBC WITH DIFFERENTIAL/PLATELET
Absolute Monocytes: 372 cells/uL (ref 200–950)
Basophils Absolute: 48 cells/uL (ref 0–200)
Basophils Relative: 0.8 %
Eosinophils Absolute: 150 cells/uL (ref 15–500)
Eosinophils Relative: 2.5 %
HCT: 48.9 % (ref 38.5–50.0)
Hemoglobin: 16.6 g/dL (ref 13.2–17.1)
Lymphs Abs: 2346 cells/uL (ref 850–3900)
MCH: 31.2 pg (ref 27.0–33.0)
MCHC: 33.9 g/dL (ref 32.0–36.0)
MCV: 91.9 fL (ref 80.0–100.0)
MPV: 11.7 fL (ref 7.5–12.5)
Monocytes Relative: 6.2 %
Neutro Abs: 3084 cells/uL (ref 1500–7800)
Neutrophils Relative %: 51.4 %
Platelets: 197 10*3/uL (ref 140–400)
RBC: 5.32 10*6/uL (ref 4.20–5.80)
RDW: 12.5 % (ref 11.0–15.0)
Total Lymphocyte: 39.1 %
WBC: 6 10*3/uL (ref 3.8–10.8)

## 2021-09-13 LAB — LIPID PANEL
Cholesterol: 209 mg/dL — ABNORMAL HIGH (ref <200)
HDL: 54 mg/dL (ref >=40)
LDL Cholesterol (Calc): 114 mg/dL (calc) — ABNORMAL HIGH
Non-HDL Cholesterol (Calc): 155 mg/dL (calc) — ABNORMAL HIGH (ref <130)
Total CHOL/HDL Ratio: 3.9 (calc) (ref <5.0)
Triglycerides: 289 mg/dL — ABNORMAL HIGH (ref <150)

## 2021-09-13 LAB — PSA: PSA: 1.06 ng/mL (ref <=4.00)

## 2021-10-02 ENCOUNTER — Telehealth: Payer: Self-pay | Admitting: Family Medicine

## 2021-10-02 NOTE — Telephone Encounter (Signed)
Patient called to follow up on last visit; asked if Dr. Dennard Schaumann prescribed any new meds.   Pharmacy confirmed as   [facility]  Please advise at [phone]

## 2021-10-02 NOTE — Telephone Encounter (Signed)
Returned called to patient's wife, Santiago Glad who states that future medication refills need to go to [facility]. Patient's wife states that the patient does not need any medication refills at this time. [facility] added as preferred pharmacy and pharmacy list updated.

## 2022-02-03 ENCOUNTER — Other Ambulatory Visit: Payer: Self-pay | Admitting: Family Medicine

## 2022-02-05 NOTE — Telephone Encounter (Signed)
Requested medication (s) are due for refill today: Yes  Requested medication (s) are on the active medication list: Yes  Last refill:  06/05/21  Future visit scheduled: No   Notes to clinic:  Historical  provider.    Requested Prescriptions  Pending Prescriptions Disp Refills   donepezil (ARICEPT) 10 MG tablet [Pharmacy Med Name: DONEPEZIL '10MG'$  TABLETS] 90 tablet     Sig: TAKE 1 TABLET(10 MG) BY MOUTH AT BEDTIME     Neurology:  Alzheimer's Agents Failed - 02/03/2022  2:05 PM      Failed - Valid encounter within last 6 months    Recent Outpatient Visits           4 months ago General medical exam   [place] Susy Frizzle, MD   1 year ago Viral upper respiratory tract infection   [place] Susy Frizzle, MD   1 year ago Memory loss   [place], Warren T, MD   1 year ago Right sided sciatica   [place] Dennard Schaumann Cammie Mcgee, MD   1 year ago Right sided sciatica   [place] Pickard, Cammie Mcgee, MD              2

## 2022-05-04 ENCOUNTER — Other Ambulatory Visit: Payer: Self-pay | Admitting: Neurology

## 2022-05-04 ENCOUNTER — Other Ambulatory Visit: Payer: Self-pay | Admitting: Family Medicine

## 2022-05-04 NOTE — Telephone Encounter (Signed)
Unable to refill per protocol, Rx expired. Medication was discontinued 09/12/21, patient preference. Will refuse.  Requested Prescriptions  Pending Prescriptions Disp Refills   fluticasone (FLONASE) 50 MCG/ACT nasal spray [Pharmacy Med Name: FLUTICASONE 50MCG NASAL SP (120) RX] 16 g 6    Sig: SHAKE LIQUID AND USE 2 SPRAYS IN EACH NOSTRIL DAILY     Ear, Nose, and Throat: Nasal Preparations - Corticosteroids Failed - 05/04/2022  6:16 AM      Failed - Valid encounter within last 12 months    Recent Outpatient Visits           7 months ago General medical exam   [place] Susy Frizzle, MD   1 year ago Viral upper respiratory tract infection   [place] Susy Frizzle, MD   1 year ago Memory loss   [place], Warren T, MD   2 years ago Right sided sciatica   [place] Susy Frizzle, MD   2 years ago Right sided sciatica   [place] Pickard, Cammie Mcgee, MD

## 2022-05-10 ENCOUNTER — Other Ambulatory Visit: Payer: Self-pay | Admitting: Neurology

## 2022-06-05 ENCOUNTER — Ambulatory Visit (INDEPENDENT_AMBULATORY_CARE_PROVIDER_SITE_OTHER): Payer: Medicare Other | Admitting: Neurology

## 2022-06-05 VITALS — BP 160/80 | HR 80 | Ht 64.0 in | Wt 168.0 lb

## 2022-06-05 DIAGNOSIS — G309 Alzheimer's disease, unspecified: Secondary | ICD-10-CM

## 2022-06-05 DIAGNOSIS — F02A11 Dementia in other diseases classified elsewhere, mild, with agitation: Secondary | ICD-10-CM | POA: Diagnosis not present

## 2022-06-05 MED ORDER — CITALOPRAM HYDROBROMIDE 10 MG PO TABS: 10.0000 mg | ORAL_TABLET | Freq: Every day | ORAL | 3 refills | Status: DC

## 2022-06-05 MED ORDER — DONEPEZIL HCL 10 MG PO TABS: 10.0000 mg | ORAL_TABLET | Freq: Every day | ORAL | 3 refills | Status: DC

## 2022-06-05 MED ORDER — MEMANTINE HCL 10 MG PO TABS: 10.0000 mg | ORAL_TABLET | Freq: Two times a day (BID) | ORAL | 3 refills | Status: DC
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
## 2022-06-06 ENCOUNTER — Encounter: Payer: Self-pay | Admitting: Neurology

## 2022-06-06 NOTE — Patient Instructions (Signed)
Continue with Aricept 10 mg nightly Start Namenda 10 mg twice daily Start Celexa 10 mg daily Continue your other medications Continue to follow up with PCP  Follow-up in 1 year or sooner if worse.

## 2022-06-08 ENCOUNTER — Other Ambulatory Visit: Payer: Self-pay | Admitting: Neurology

## 2022-06-11 ENCOUNTER — Other Ambulatory Visit: Payer: Self-pay | Admitting: Neurology

## 2022-07-05 ENCOUNTER — Ambulatory Visit (INDEPENDENT_AMBULATORY_CARE_PROVIDER_SITE_OTHER): Payer: Medicare Other | Admitting: Family Medicine

## 2022-07-05 VITALS — BP 132/74 | HR 69 | Temp 98.3°F | Ht 64.0 in | Wt 168.0 lb

## 2022-07-05 DIAGNOSIS — J069 Acute upper respiratory infection, unspecified: Secondary | ICD-10-CM | POA: Diagnosis not present

## 2022-07-05 DIAGNOSIS — F0280 Dementia in other diseases classified elsewhere without behavioral disturbance: Secondary | ICD-10-CM | POA: Insufficient documentation

## 2022-07-05 NOTE — Progress Notes (Signed)
Subjective:    Patient ID: Jose Fisher, male    DOB: 03/21/1956, 67 y.o.   MRN: [id number]  Cough  Patient is a 68 year old Caucasian gentleman who his wife and son both are dealing with an upper respiratory infection.  Wife recently had conjunctivitis.  She also has bronchitis.  Son is dealing with otitis media.  He reports chest congestion now for the last few days.  He reports a nonproductive cough.  He denies any fevers or chills.  He denies any shortness of breath.  He denies any pleurisy or chest pain.  He does report drainage in his throat and his upper airways.  He also reports dry itchy eyes that are matted shut in the morning.  Right now he does not have any conjunctivitis.  His conjunctiva are not erythematous or injected.  There is trace amount of dried drainage at the corners of his eyes bilaterally but no significant exudate and no purulent exudate.  He denies any pain in his eyes. Past Medical History:  Diagnosis Date   Arthritis    Colon cancer ([place])    Family history of colon cancer    Family history of ovarian cancer    Lynch syndrome    increased risk of cancers of renal pelvis, ureter, stomach, small bowel, bile duct, skin (sebaceous neoplasms), and brain (gliomas   Memory loss    MRI- mild atrophy, microvascular changes, left temporal encephalomalacia (2021) at [place] history of colon cancer 05/13/2017   Personal history of colonic polyps 05/13/2017   Wears glasses    Past Surgical History:  Procedure Laterality Date   APPENDECTOMY     COLECTOMY  1985   hemi   COLONOSCOPY     INGUINAL HERNIA REPAIR     bilat   SHOULDER ARTHROSCOPY  2011   RCR-DSC-right stayed [facility]   SHOULDER ARTHROSCOPY WITH ROTATOR CUFF REPAIR AND SUBACROMIAL DECOMPRESSION Left 08/21/2012   Procedure: LEFT SHOULDER ARTHROSCOPY SUBACROMIAL DECOMPRESSION, DISTAL CLAVICLE RESECTION AND REPAIR ROTATOR CUFF;  Surgeon: Cammie Sickle., MD;  Location: [place];  Service:  Orthopedics;  Laterality: Left;   TONSILLECTOMY     WRIST GANGLION EXCISION  1978   lt   Current Outpatient Medications on File Prior to Visit  Medication Sig Dispense Refill   aspirin 81 MG chewable tablet Chew 81 mg by mouth 2 (two) times daily.     citalopram (CELEXA) 10 MG tablet Take 1 tablet (10 mg total) by mouth daily. 90 tablet 3   donepezil (ARICEPT) 10 MG tablet Take 1 tablet (10 mg total) by mouth at bedtime. 90 tablet 3   Garlic [id number] MG CAPS Take 1 capsule by mouth daily.     Ginger 500 MG CAPS Take 1 capsule by mouth daily.     levocetirizine (XYZAL) 5 MG tablet TAKE 1 TABLET(5 MG) BY MOUTH EVERY EVENING 90 tablet 0   memantine (NAMENDA) 10 MG tablet Take 1 tablet (10 mg total) by mouth 2 (two) times daily. 180 tablet 3   Multiple Vitamins-Minerals (ONE-A-DAY MENS 50+ PO) Take 1 tablet by mouth daily.     Turmeric (QC TUMERIC COMPLEX PO) Take 1 tablet by mouth 2 (two) times daily. 1519m     No current facility-administered medications on file prior to visit.   No Known Allergies Social History   Socioeconomic History   Marital status: Married    Spouse name: Not on file   Number of children: Not on file  Years of education: Not on file   Highest education level: Not on file  Occupational History   Not on file  Tobacco Use   Smoking status: Never   Smokeless tobacco: Never  Substance and Sexual Activity   Alcohol use: Yes    Comment: occ   Drug use: No   Sexual activity: Not on file  Other Topics Concern   Not on file  Social History Narrative   Not on file   Social Determinants of Health   Financial Resource Strain: Not on file  Food Insecurity: Not on file  Transportation Needs: Not on file  Physical Activity: Not on file  Stress: Not on file  Social Connections: Not on file  Intimate Partner Violence: Not on file   Family History  Problem Relation Age of Onset   Colon cancer Mother 53       says she also had 'intestinal cancer' in records    Ovarian cancer Mother 69   Liver cancer Mother    Heart disease Father    Stroke Father 43   Colon polyps Son 3   Heart disease Paternal Grandfather       Review of Systems  Respiratory:  Positive for cough.   All other systems reviewed and are negative.      Objective:   Physical Exam Vitals reviewed.  Constitutional:      General: He is not in acute distress.    Appearance: Normal appearance. He is well-developed. He is not diaphoretic.  HENT:     Head: Normocephalic and atraumatic.     Right Ear: Tympanic membrane, ear canal and external ear normal.     Left Ear: Tympanic membrane, ear canal and external ear normal.     Nose: Nose normal.     Mouth/Throat:     Pharynx: Oropharynx is clear. No oropharyngeal exudate or posterior oropharyngeal erythema.  Eyes:     General:        Right eye: No discharge.        Left eye: No discharge.     Conjunctiva/sclera: Conjunctivae normal.  Neck:     Thyroid: No thyromegaly.     Vascular: No JVD.     Trachea: No tracheal deviation.  Cardiovascular:     Rate and Rhythm: Normal rate and regular rhythm.     Heart sounds: Normal heart sounds. No murmur heard.    No friction rub. No gallop.  Pulmonary:     Effort: Pulmonary effort is normal. No respiratory distress.     Breath sounds: Normal breath sounds. No stridor. No wheezing or rales.  Chest:     Chest wall: No tenderness.  Genitourinary:    Penis: No tenderness.   Musculoskeletal:     Left upper arm: Tenderness present.     Left elbow: No deformity, effusion or lacerations. Normal range of motion. Tenderness present.       Arms:  Lymphadenopathy:     Cervical: No cervical adenopathy.  Neurological:     Mental Status: He is alert.     Motor: No abnormal muscle tone.     Deep Tendon Reflexes: Reflexes are normal and symmetric.           Assessment & Plan:  Viral upper respiratory tract infection  Patient appears to be suffering from the same viral upper  respiratory infection with his wife and his son have.  Thankfully there is no evidence of any serious bacterial infection.  Recommended supportive care only at  the present time.  Recommended Mucinex DM for cough, chest congestion, and postnasal drip.  Recommended Visine artificial tears for dry sensitivity.  At the present time there is no evidence of conjunctivitis however I did encourage the patient not to touch his eyes repeatedly as he is doing during our encounter as this could give him conjunctivitis since his wife is battling this at the present time.  I explained that conjunctivitis is due to touch and is very contagious.

## 2022-08-29 ENCOUNTER — Other Ambulatory Visit: Payer: Self-pay | Admitting: Neurology

## 2022-09-03 ENCOUNTER — Other Ambulatory Visit (HOSPITAL_COMMUNITY): Payer: Self-pay

## 2022-09-26 ENCOUNTER — Telehealth: Payer: Self-pay | Admitting: Family Medicine

## 2022-09-26 NOTE — Telephone Encounter (Signed)
Contacted Jose Fisher to schedule their annual wellness visit. Appointment made for 09/27/2022.  Thank you,  [facility] Support [facility] Direct dial  [phone]

## 2022-09-27 ENCOUNTER — Other Ambulatory Visit: Payer: Self-pay | Admitting: Neurology

## 2022-09-27 ENCOUNTER — Ambulatory Visit (INDEPENDENT_AMBULATORY_CARE_PROVIDER_SITE_OTHER): Payer: Medicare Other

## 2022-09-27 VITALS — BP 130/74 | Ht 64.0 in | Wt 175.0 lb

## 2022-09-27 DIAGNOSIS — Z0000 Encounter for general adult medical examination without abnormal findings: Secondary | ICD-10-CM | POA: Diagnosis not present

## 2022-09-27 NOTE — Progress Notes (Signed)
Subjective:   Jose Fisher is a 67 y.o. male who presents for Medicare Annual/Subsequent preventive examination.  Review of Systems     Cardiac Risk Factors include: advanced age (>54men, >76 women);hypertension;male gender;dyslipidemia     Objective:    Today's Vitals   09/27/22 1409  BP: 130/74  Weight: 175 lb (79.4 kg)  Height: 5\' 4"  (1.626 m)   Body mass index is 30.04 kg/m.     09/27/2022    2:56 PM 09/09/2020    2:47 PM 10/05/2019    1:10 PM 08/20/2012    9:40 AM  Advanced Directives  Does Patient Have a Medical Advance Directive? No No No Patient does not have advance directive  Does patient want to make changes to medical advance directive?  Yes (MAU/Ambulatory/Procedural Areas - Information given)    Would patient like information on creating a medical advance directive? Yes (MAU/Ambulatory/Procedural Areas - Information given) Yes (MAU/Ambulatory/Procedural Areas - Information given) Yes (ED - Information included in AVS)     Current Medications (verified) Outpatient Encounter Medications as of 09/27/2022  Medication Sig   aspirin 81 MG chewable tablet Chew 81 mg by mouth 2 (two) times daily.   citalopram (CELEXA) 10 MG tablet Take 1 tablet (10 mg total) by mouth daily.   donepezil (ARICEPT) 10 MG tablet Take 1 tablet (10 mg total) by mouth at bedtime.   Garlic 1000 MG CAPS Take 1 capsule by mouth daily.   Ginger 500 MG CAPS Take 1 capsule by mouth daily.   levocetirizine (XYZAL) 5 MG tablet TAKE 1 TABLET(5 MG) BY MOUTH EVERY EVENING   memantine (NAMENDA) 10 MG tablet TAKE 1 TABLET(10 MG) BY MOUTH TWICE DAILY   Multiple Vitamins-Minerals (ONE-A-DAY MENS 50+ PO) Take 1 tablet by mouth daily.   Turmeric (QC TUMERIC COMPLEX PO) Take 1 tablet by mouth 2 (two) times daily. 1500mg    No facility-administered encounter medications on file as of 09/27/2022.    Allergies (verified) Patient has no known allergies.   History: Past Medical History:  Diagnosis Date    Arthritis    Colon cancer ([facility])    Family history of colon cancer    Family history of ovarian cancer    Lynch syndrome    increased risk of cancers of renal pelvis, ureter, stomach, small bowel, bile duct, skin (sebaceous neoplasms), and brain (gliomas   Memory loss    MRI- mild atrophy, microvascular changes, left temporal encephalomalacia (2021) at [facility]   Personal history of colon cancer 05/13/2017   Personal history of colonic polyps 05/13/2017   Wears glasses    Past Surgical History:  Procedure Laterality Date   APPENDECTOMY     COLECTOMY  1985   hemi   COLONOSCOPY     INGUINAL HERNIA REPAIR     bilat   SHOULDER ARTHROSCOPY  2011   RCR-DSC-right stayed [facility]   SHOULDER ARTHROSCOPY WITH ROTATOR CUFF REPAIR AND SUBACROMIAL DECOMPRESSION Left 08/21/2012   Procedure: LEFT SHOULDER ARTHROSCOPY SUBACROMIAL DECOMPRESSION, DISTAL CLAVICLE RESECTION AND REPAIR ROTATOR CUFF;  Surgeon: Wyn Forster., MD;  Location: [facility];  Service: Orthopedics;  Laterality: Left;   TONSILLECTOMY     WRIST GANGLION EXCISION  1978   lt   Family History  Problem Relation Age of Onset   Colon cancer Mother 25       says she also had 'intestinal cancer' in records   Ovarian cancer Mother 2   Liver cancer Mother    Heart disease  Father    Stroke Father 77   Colon polyps Son 51   Heart disease Paternal Grandfather    Social History   Socioeconomic History   Marital status: Married    Spouse name: Not on file   Number of children: Not on file   Years of education: Not on file   Highest education level: Not on file  Occupational History   Not on file  Tobacco Use   Smoking status: Never   Smokeless tobacco: Never  Substance and Sexual Activity   Alcohol use: Yes    Comment: occ   Drug use: No   Sexual activity: Not on file  Other Topics Concern   Not on file  Social History Narrative   Not on file   Social Determinants of Health   Financial Resource Strain: Low  Risk  (09/27/2022)   Overall Financial Resource Strain (CARDIA)    Difficulty of Paying Living Expenses: Not hard at all  Food Insecurity: No Food Insecurity (09/27/2022)   Hunger Vital Sign    Worried About Running Out of Food in the Last Year: Never true    Ran Out of Food in the Last Year: Never true  Transportation Needs: No Transportation Needs (09/27/2022)   PRAPARE - Administrator, Civil Service (Medical): No    Lack of Transportation (Non-Medical): No  Physical Activity: Insufficiently Active (09/27/2022)   Exercise Vital Sign    Days of Exercise per Week: 3 days    Minutes of Exercise per Session: 30 min  Stress: Stress Concern Present (09/27/2022)   [employer] of Occupational Health - Occupational Stress Questionnaire    Feeling of Stress : To some extent  Social Connections: Moderately Integrated (09/27/2022)   Social Connection and Isolation Panel [NHANES]    Frequency of Communication with Friends and Family: More than three times a week    Frequency of Social Gatherings with Friends and Family: Three times a week    Attends Religious Services: 1 to 4 times per year    Active Member of Clubs or Organizations: No    Attends Banker Meetings: Never    Marital Status: Married    Tobacco Counseling Counseling given: Not Answered   Clinical Intake:  Pre-visit preparation completed: Yes  Pain : No/denies pain     Diabetes: No  How often do you need to have someone help you when you read instructions, pamphlets, or other written materials from your doctor or pharmacy?: 4 - Often  Diabetic?No  Interpreter Needed?: No  Information entered by :: Kandis Fantasia LPN   Activities of Daily Living    09/27/2022    2:56 PM  In your present state of health, do you have any difficulty performing the following activities:  Hearing? 0  Vision? 0  Difficulty concentrating or making decisions? 1  Walking or climbing stairs? 0  Dressing or  bathing? 0  Doing errands, shopping? 1  Preparing Food and eating ? N  Using the Toilet? N  In the past six months, have you accidently leaked urine? N  Do you have problems with loss of bowel control? N  Managing your Medications? Y  Managing your Finances? Y  Housekeeping or managing your Housekeeping? Y    Patient Care Team: Donita Brooks, MD as PCP - General (Family Medicine) Windell Norfolk, MD as Consulting Physician (Neurology) Clinic, [facility]  Indicate any recent Medical Services you may have received from other  than [facility] providers in the past year (date may be approximate).     Assessment:   This is a routine wellness examination for Jose Fisher.  Hearing/Vision screen Hearing Screening - Comments:: Denies hearing difficulties   Vision Screening - Comments:: Wears rx glasses - up to date with routine eye exams with [facility]   Dietary issues and exercise activities discussed: Current Exercise Habits: The patient has a physically strenuous job, but has no regular exercise apart from work.   Goals Addressed             This Visit's Progress    Remain active        Depression Screen    09/27/2022    2:55 PM 07/05/2022    9:09 AM 09/12/2021    8:26 AM 09/09/2020    2:45 PM  PHQ 2/9 Scores  PHQ - 2 Score 0 0 1 0  PHQ- 9 Score   7 0    Fall Risk    09/27/2022    2:54 PM 07/05/2022    9:09 AM 09/12/2021    8:20 AM 09/09/2020    2:45 PM  Fall Risk   Falls in the past year? 0 0 0 0  Number falls in past yr: 0 0 0   Injury with Fall? 0 0 0   Risk for fall due to : No Fall Risks No Fall Risks  No Fall Risks  Follow up Falls prevention discussed;Education provided;Falls evaluation completed Falls prevention discussed  Falls evaluation completed    FALL RISK PREVENTION PERTAINING TO THE HOME:  Any stairs in or around the home? No  If so, are there any without handrails? No  Home free of loose throw rugs in walkways,  pet beds, electrical cords, etc? Yes  Adequate lighting in your home to reduce risk of falls? Yes   ASSISTIVE DEVICES UTILIZED TO PREVENT FALLS:  Life alert? No  Use of a cane, walker or w/c? No  Grab bars in the bathroom? Yes  Shower chair or bench in shower? No  Elevated toilet seat or a handicapped toilet? Yes   TIMED UP AND GO:  Was the test performed? Yes .  Length of time to ambulate 10 feet: 6 sec.   Gait steady and fast without use of assistive device  Cognitive Function:    06/05/2022    2:53 PM 06/05/2021    3:21 PM  MMSE - Mini Mental State Exam  Orientation to time 4 4  Orientation to Place 3 4  Registration 3 3  Attention/ Calculation 1 1  Recall 0 0  Language- name 2 objects 1 1  Language- repeat 0 1  Language- follow 3 step command 2 3  Language- read & follow direction 0 1  Write a sentence 1 0  Copy design 1 1  Total score 16 19        09/27/2022    2:56 PM  6CIT Screen  What Year? 0 points  What month? 3 points  What time? 0 points  Count back from 20 2 points  Months in reverse 2 points  Repeat phrase 10 points  Total Score 17 points    Immunizations Immunization History  Administered Date(s) Administered   Fluad Quad(high Dose 65+) 04/28/2020   Influenza Inj Mdck Quad Pf 05/10/2018   Influenza,inj,Quad PF,6+ Mos 05/27/2014, 03/23/2017   Influenza-Unspecified 03/04/2006, 02/20/2021   Moderna Sars-Covid-2 Vaccination 05/16/2019, 07/15/2019, 08/14/2019, 06/14/2020   PNEUMOCOCCAL CONJUGATE-20 09/12/2021   Zoster Recombinat (Shingrix)  05/10/2018, 07/12/2018    TDAP status: Due, Education has been provided regarding the importance of this vaccine. Advised may receive this vaccine at local pharmacy or Health Dept. Aware to provide a copy of the vaccination record if obtained from local pharmacy or Health Dept. Verbalized acceptance and understanding.  Pneumococcal vaccine status: Up to date  Covid-19 vaccine status: Information provided on  how to obtain vaccines.   Qualifies for Shingles Vaccine? Yes   Zostavax completed No   Shingrix Completed?: Yes  Screening Tests Health Maintenance  Topic Date Due   COVID-19 Vaccine (5 - 2023-24 season) 01/12/2022   INFLUENZA VACCINE  12/13/2022   Medicare Annual Wellness (AWV)  09/27/2023   COLONOSCOPY (Pts 45-31yrs Insurance coverage will need to be confirmed)  05/14/2024   Pneumonia Vaccine 55+ Years old  Completed   Hepatitis C Screening  Completed   Zoster Vaccines- Shingrix  Completed   HPV VACCINES  Aged Out   DTaP/Tdap/Td  Discontinued    Health Maintenance  Health Maintenance Due  Topic Date Due   COVID-19 Vaccine (5 - 2023-24 season) 01/12/2022    Colorectal cancer screening: Type of screening: Colonoscopy. Completed 10/05/22. Repeat every 1 years  Lung Cancer Screening: (Low Dose CT Chest recommended if Age 23-80 years, 30 pack-year currently smoking OR have quit w/in 15years.) does not qualify.   Lung Cancer Screening Referral: n/a  Additional Screening:  Hepatitis C Screening: does qualify; Completed 04/27/21  Vision Screening: Recommended annual ophthalmology exams for early detection of glaucoma and other disorders of the eye. Is the patient up to date with their annual eye exam?  Yes  Who is the provider or what is the name of the office in which the patient attends annual eye exams? [facility] If pt is not established with a provider, would they like to be referred to a provider to establish care? No .   Dental Screening: Recommended annual dental exams for proper oral hygiene  Community Resource Referral / Chronic Care Management: CRR required this visit?  No   CCM required this visit?  No      Plan:     I have personally reviewed and noted the following in the patient's chart:   Medical and social history Use of alcohol, tobacco or illicit drugs  Current medications and supplements including opioid prescriptions. Patient is not currently taking  opioid prescriptions. Functional ability and status Nutritional status Physical activity Advanced directives List of other physicians Hospitalizations, surgeries, and ER visits in previous 12 months Vitals Screenings to include cognitive, depression, and falls Referrals and appointments  In addition, I have reviewed and discussed with patient certain preventive protocols, quality metrics, and best practice recommendations. A written personalized care plan for preventive services as well as general preventive health recommendations were provided to patient.     Kandis Fantasia [place], [place]   1/61/0960   Nurse Notes: Patient confused on how he got to office.  When asked he stated that "you called the bus for me" upon checking with front office it was discovered that patient drove himself to the appointment.  Attempted to reach wife once patient left to alert her that he was on his way but there was no answer.

## 2022-09-27 NOTE — Patient Instructions (Addendum)
Jose Fisher , Thank you for taking time to come for your Medicare Wellness Visit. I appreciate your ongoing commitment to your health goals. Please review the following plan we discussed and let me know if I can assist you in the future.   These are the goals we discussed:  Goals   None     This is a list of the screening recommended for you and due dates:  Health Maintenance  Topic Date Due   COVID-19 Vaccine (5 - 2023-24 season) 01/12/2022   Flu Shot  12/13/2022   Medicare Annual Wellness Visit  09/27/2023   Colon Cancer Screening  05/14/2024   Pneumonia Vaccine  Completed   Hepatitis C Screening: USPSTF Recommendation to screen - Ages 18-79 yo.  Completed   Zoster (Shingles) Vaccine  Completed   HPV Vaccine  Aged Out   DTaP/Tdap/Td vaccine  Discontinued    Advanced directives: Information on Advanced Care Planning can be found at [facility] [place] of [facility] Advance Health Care Directives Advance Health Care Directives ([email or web address]    Conditions/risks identified: Aim for 30 minutes of exercise or brisk walking, 6-8 glasses of water, and 5 servings of fruits and vegetables each day.  Next appointment: Follow up in one year for your annual wellness visit.   Preventive Care 69 Years and Older, Male  Preventive care refers to lifestyle choices and visits with your health care provider that can promote health and wellness. What does preventive care include? A yearly physical exam. This is also called an annual well check. Dental exams once or twice a year. Routine eye exams. Ask your health care provider how often you should have your eyes checked. Personal lifestyle choices, including: Daily care of your teeth and gums. Regular physical activity. Eating a healthy diet. Avoiding tobacco and drug use. Limiting alcohol use. Practicing safe sex. Taking low doses of aspirin every day. Taking vitamin and mineral supplements as recommended by your health care provider. What  happens during an annual well check? The services and screenings done by your health care provider during your annual well check will depend on your age, overall health, lifestyle risk factors, and family history of disease. Counseling  Your health care provider may ask you questions about your: Alcohol use. Tobacco use. Drug use. Emotional well-being. Home and relationship well-being. Sexual activity. Eating habits. History of falls. Memory and ability to understand (cognition). Work and work Astronomer. Screening  You may have the following tests or measurements: Height, weight, and BMI. Blood pressure. Lipid and cholesterol levels. These may be checked every 5 years, or more frequently if you are over 23 years old. Skin check. Lung cancer screening. You may have this screening every year starting at age 12 if you have a 30-pack-year history of smoking and currently smoke or have quit within the past 15 years. Fecal occult blood test (FOBT) of the stool. You may have this test every year starting at age 26. Flexible sigmoidoscopy or colonoscopy. You may have a sigmoidoscopy every 5 years or a colonoscopy every 10 years starting at age 24. Prostate cancer screening. Recommendations will vary depending on your family history and other risks. Hepatitis C blood test. Hepatitis B blood test. Sexually transmitted disease (STD) testing. Diabetes screening. This is done by checking your blood sugar (glucose) after you have not eaten for a while (fasting). You may have this done every 1-3 years. Abdominal aortic aneurysm (AAA) screening. You may need this if you are a current or former smoker.  Osteoporosis. You may be screened starting at age 76 if you are at high risk. Talk with your health care provider about your test results, treatment options, and if necessary, the need for more tests. Vaccines  Your health care provider may recommend certain vaccines, such as: Influenza vaccine. This  is recommended every year. Tetanus, diphtheria, and acellular pertussis (Tdap, Td) vaccine. You may need a Td booster every 10 years. Zoster vaccine. You may need this after age 35. Pneumococcal 13-valent conjugate (PCV13) vaccine. One dose is recommended after age 62. Pneumococcal polysaccharide (PPSV23) vaccine. One dose is recommended after age 83. Talk to your health care provider about which screenings and vaccines you need and how often you need them. This information is not intended to replace advice given to you by your health care provider. Make sure you discuss any questions you have with your health care provider. Document Released: 05/27/2015 Document Revised: 01/18/2016 Document Reviewed: 03/01/2015 Elsevier Interactive Patient Education  2017 [employer].  Fall Prevention in the Home Falls can cause injuries. They can happen to people of all ages. There are many things you can do to make your home safe and to help prevent falls. What can I do on the outside of my home? Regularly fix the edges of walkways and driveways and fix any cracks. Remove anything that might make you trip as you walk through a door, such as a raised step or threshold. Trim any bushes or trees on the path to your home. Use bright outdoor lighting. Clear any walking paths of anything that might make someone trip, such as rocks or tools. Regularly check to see if handrails are loose or broken. Make sure that both sides of any steps have handrails. Any raised decks and porches should have guardrails on the edges. Have any leaves, snow, or ice cleared regularly. Use sand or salt on walking paths during winter. Clean up any spills in your garage right away. This includes oil or grease spills. What can I do in the bathroom? Use night lights. Install grab bars by the toilet and in the tub and shower. Do not use towel bars as grab bars. Use non-skid mats or decals in the tub or shower. If you need to sit down  in the shower, use a plastic, non-slip stool. Keep the floor dry. Clean up any water that spills on the floor as soon as it happens. Remove soap buildup in the tub or shower regularly. Attach bath mats securely with double-sided non-slip rug tape. Do not have throw rugs and other things on the floor that can make you trip. What can I do in the bedroom? Use night lights. Make sure that you have a light by your bed that is easy to reach. Do not use any sheets or blankets that are too big for your bed. They should not hang down onto the floor. Have a firm chair that has side arms. You can use this for support while you get dressed. Do not have throw rugs and other things on the floor that can make you trip. What can I do in the kitchen? Clean up any spills right away. Avoid walking on wet floors. Keep items that you use a lot in easy-to-reach places. If you need to reach something above you, use a strong step stool that has a grab bar. Keep electrical cords out of the way. Do not use floor polish or wax that makes floors slippery. If you must use wax, use non-skid floor  wax. Do not have throw rugs and other things on the floor that can make you trip. What can I do with my stairs? Do not leave any items on the stairs. Make sure that there are handrails on both sides of the stairs and use them. Fix handrails that are broken or loose. Make sure that handrails are as long as the stairways. Check any carpeting to make sure that it is firmly attached to the stairs. Fix any carpet that is loose or worn. Avoid having throw rugs at the top or bottom of the stairs. If you do have throw rugs, attach them to the floor with carpet tape. Make sure that you have a light switch at the top of the stairs and the bottom of the stairs. If you do not have them, ask someone to add them for you. What else can I do to help prevent falls? Wear shoes that: Do not have high heels. Have rubber bottoms. Are comfortable  and fit you well. Are closed at the toe. Do not wear sandals. If you use a stepladder: Make sure that it is fully opened. Do not climb a closed stepladder. Make sure that both sides of the stepladder are locked into place. Ask someone to hold it for you, if possible. Clearly mark and make sure that you can see: Any grab bars or handrails. First and last steps. Where the edge of each step is. Use tools that help you move around (mobility aids) if they are needed. These include: Canes. Walkers. Scooters. Crutches. Turn on the lights when you go into a dark area. Replace any light bulbs as soon as they burn out. Set up your furniture so you have a clear path. Avoid moving your furniture around. If any of your floors are uneven, fix them. If there are any pets around you, be aware of where they are. Review your medicines with your doctor. Some medicines can make you feel dizzy. This can increase your chance of falling. Ask your doctor what other things that you can do to help prevent falls. This information is not intended to replace advice given to you by your health care provider. Make sure you discuss any questions you have with your health care provider. Document Released: 02/24/2009 Document Revised: 10/06/2015 Document Reviewed: 06/04/2014 Elsevier Interactive Patient Education  2017 [employer].

## 2022-11-04 ENCOUNTER — Other Ambulatory Visit: Payer: Self-pay | Admitting: Neurology

## 2022-12-04 ENCOUNTER — Other Ambulatory Visit: Payer: Self-pay | Admitting: Neurology

## 2023-01-03 ENCOUNTER — Ambulatory Visit: Payer: Medicare Other | Admitting: Family Medicine

## 2023-01-24 ENCOUNTER — Ambulatory Visit (INDEPENDENT_AMBULATORY_CARE_PROVIDER_SITE_OTHER): Payer: Medicare Other | Admitting: Family Medicine

## 2023-01-24 VITALS — BP 120/86 | HR 62 | Temp 98.5°F | Ht 64.0 in | Wt 170.4 lb

## 2023-01-24 DIAGNOSIS — Z85038 Personal history of other malignant neoplasm of large intestine: Secondary | ICD-10-CM

## 2023-01-24 DIAGNOSIS — G309 Alzheimer's disease, unspecified: Secondary | ICD-10-CM | POA: Diagnosis not present

## 2023-01-24 DIAGNOSIS — Z0000 Encounter for general adult medical examination without abnormal findings: Secondary | ICD-10-CM | POA: Diagnosis not present

## 2023-01-24 DIAGNOSIS — Z0001 Encounter for general adult medical examination with abnormal findings: Secondary | ICD-10-CM | POA: Diagnosis not present

## 2023-01-24 DIAGNOSIS — F0280 Dementia in other diseases classified elsewhere without behavioral disturbance: Secondary | ICD-10-CM | POA: Diagnosis not present

## 2023-01-24 DIAGNOSIS — Z125 Encounter for screening for malignant neoplasm of prostate: Secondary | ICD-10-CM

## 2023-01-24 DIAGNOSIS — Z1509 Genetic susceptibility to other malignant neoplasm: Secondary | ICD-10-CM

## 2023-01-24 MED ORDER — BREXPIPRAZOLE 1 MG PO TABS: 1.0000 mg | ORAL_TABLET | Freq: Every day | ORAL | 1 refills | Status: DC
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
## 2023-01-25 ENCOUNTER — Telehealth: Payer: Self-pay | Admitting: Family Medicine

## 2023-01-25 LAB — LIPID PANEL
Cholesterol: 226 mg/dL — ABNORMAL HIGH (ref <200)
HDL: 53 mg/dL (ref >=40)
LDL Cholesterol (Calc): 126 mg/dL — ABNORMAL HIGH
Non-HDL Cholesterol (Calc): 173 mg/dL — ABNORMAL HIGH (ref <130)
Total CHOL/HDL Ratio: 4.3 (calc) (ref <5.0)
Triglycerides: 331 mg/dL — ABNORMAL HIGH (ref <150)

## 2023-01-25 LAB — CBC WITH DIFFERENTIAL/PLATELET
Absolute Monocytes: 328 {cells}/uL (ref 200–950)
Basophils Absolute: 42 {cells}/uL (ref 0–200)
Basophils Relative: 0.8 %
Eosinophils Absolute: 140 {cells}/uL (ref 15–500)
Eosinophils Relative: 2.7 %
HCT: 46.1 % (ref 38.5–50.0)
Hemoglobin: 15.7 g/dL (ref 13.2–17.1)
Lymphs Abs: 1919 {cells}/uL (ref 850–3900)
MCH: 31 pg (ref 27.0–33.0)
MCHC: 34.1 g/dL (ref 32.0–36.0)
MCV: 91.1 fL (ref 80.0–100.0)
MPV: 11.8 fL (ref 7.5–12.5)
Monocytes Relative: 6.3 %
Neutro Abs: 2772 {cells}/uL (ref 1500–7800)
Neutrophils Relative %: 53.3 %
Platelets: 197 10*3/uL (ref 140–400)
RBC: 5.06 10*6/uL (ref 4.20–5.80)
RDW: 12.9 % (ref 11.0–15.0)
Total Lymphocyte: 36.9 %
WBC: 5.2 10*3/uL (ref 3.8–10.8)

## 2023-01-25 LAB — COMPLETE METABOLIC PANEL WITH GFR
AG Ratio: 1.8 (calc) (ref 1.0–2.5)
ALT: 14 U/L (ref 9–46)
AST: 18 U/L (ref 10–35)
Albumin: 4.4 g/dL (ref 3.6–5.1)
Alkaline phosphatase (APISO): 62 U/L (ref 35–144)
BUN: 12 mg/dL (ref 7–25)
CO2: 29 mmol/L (ref 20–32)
Calcium: 9.5 mg/dL (ref 8.6–10.3)
Chloride: 102 mmol/L (ref 98–110)
Creat: 0.77 mg/dL (ref 0.70–1.35)
Globulin: 2.5 g/dL (ref 1.9–3.7)
Glucose, Bld: 78 mg/dL (ref 65–99)
Potassium: 4.4 mmol/L (ref 3.5–5.3)
Sodium: 141 mmol/L (ref 135–146)
Total Bilirubin: 0.6 mg/dL (ref 0.2–1.2)
Total Protein: 6.9 g/dL (ref 6.1–8.1)
eGFR: 98 mL/min/{1.73_m2} (ref >=60)

## 2023-01-25 LAB — VITAMIN B12: Vitamin B-12: 615 pg/mL (ref 200–1100)

## 2023-01-25 LAB — PSA: PSA: 0.88 ng/mL (ref <=4.00)

## 2023-01-25 LAB — TSH: TSH: 0.58 m[IU]/L (ref 0.40–4.50)

## 2023-01-30 ENCOUNTER — Telehealth: Payer: Self-pay

## 2023-01-30 DIAGNOSIS — K635 Polyp of colon: Secondary | ICD-10-CM | POA: Insufficient documentation

## 2023-01-30 NOTE — Telephone Encounter (Signed)
Jose Fisher (Key: [id number]  [facility] is reviewing your PA request. Typically an electronic response will be received within 24-72 hours. To check for an update later, open this request from your dashboard.  You may close this dialog and return to your dashboard to perform other ta

## 2023-04-25 ENCOUNTER — Other Ambulatory Visit: Payer: Self-pay | Admitting: Family Medicine

## 2023-06-04 ENCOUNTER — Encounter: Payer: Self-pay | Admitting: Neurology

## 2023-06-04 ENCOUNTER — Ambulatory Visit (INDEPENDENT_AMBULATORY_CARE_PROVIDER_SITE_OTHER): Payer: Medicare Other | Admitting: Neurology

## 2023-06-04 VITALS — BP 124/78 | Ht 63.0 in | Wt 168.0 lb

## 2023-06-04 DIAGNOSIS — G300 Alzheimer's disease with early onset: Secondary | ICD-10-CM

## 2023-06-04 DIAGNOSIS — F02B11 Dementia in other diseases classified elsewhere, moderate, with agitation: Secondary | ICD-10-CM | POA: Diagnosis not present

## 2023-06-04 MED ORDER — DONEPEZIL HCL 10 MG PO TABS: 10.0000 mg | ORAL_TABLET | Freq: Every day | ORAL | 3 refills | Status: AC

## 2023-06-04 MED ORDER — CITALOPRAM HYDROBROMIDE 20 MG PO TABS: 20.0000 mg | ORAL_TABLET | Freq: Every day | ORAL | 3 refills | Status: DC

## 2023-06-04 MED ORDER — BREXPIPRAZOLE 2 MG PO TABS: 2.0000 mg | ORAL_TABLET | Freq: Every day | ORAL | 11 refills | Status: DC

## 2023-06-04 MED ORDER — MEMANTINE HCL 10 MG PO TABS: 10.0000 mg | ORAL_TABLET | Freq: Two times a day (BID) | ORAL | 3 refills | Status: AC

## 2023-06-04 NOTE — Patient Instructions (Signed)
Increase Rexulti to 2 mg daily  Increase Celexa to 20 mg daily  Continue Aricept 10 mg nightly and Namenda 10 mg twice daily  Continue your other medications  Return in a year or sooner if worse
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
## 2023-09-17 DIAGNOSIS — D010 Carcinoma in situ of colon: Secondary | ICD-10-CM | POA: Diagnosis not present

## 2023-09-17 DIAGNOSIS — G300 Alzheimer's disease with early onset: Secondary | ICD-10-CM | POA: Diagnosis not present

## 2023-09-17 DIAGNOSIS — M1991 Primary osteoarthritis, unspecified site: Secondary | ICD-10-CM | POA: Diagnosis not present

## 2023-09-23 DIAGNOSIS — G300 Alzheimer's disease with early onset: Secondary | ICD-10-CM | POA: Diagnosis not present

## 2023-09-23 DIAGNOSIS — M1991 Primary osteoarthritis, unspecified site: Secondary | ICD-10-CM | POA: Diagnosis not present

## 2023-09-26 DIAGNOSIS — G300 Alzheimer's disease with early onset: Secondary | ICD-10-CM | POA: Diagnosis not present

## 2023-09-26 DIAGNOSIS — Z79899 Other long term (current) drug therapy: Secondary | ICD-10-CM | POA: Diagnosis not present

## 2023-10-01 DIAGNOSIS — G301 Alzheimer's disease with late onset: Secondary | ICD-10-CM | POA: Diagnosis not present

## 2023-10-03 ENCOUNTER — Ambulatory Visit: Payer: Medicare Other

## 2023-10-15 DIAGNOSIS — G301 Alzheimer's disease with late onset: Secondary | ICD-10-CM | POA: Diagnosis not present

## 2023-10-21 DIAGNOSIS — G300 Alzheimer's disease with early onset: Secondary | ICD-10-CM | POA: Diagnosis not present

## 2023-10-21 DIAGNOSIS — R451 Restlessness and agitation: Secondary | ICD-10-CM | POA: Diagnosis not present

## 2023-10-28 DIAGNOSIS — R451 Restlessness and agitation: Secondary | ICD-10-CM | POA: Diagnosis not present

## 2023-10-28 DIAGNOSIS — Z9183 Wandering in diseases classified elsewhere: Secondary | ICD-10-CM | POA: Diagnosis not present

## 2023-10-28 DIAGNOSIS — G300 Alzheimer's disease with early onset: Secondary | ICD-10-CM | POA: Diagnosis not present

## 2023-11-14 DIAGNOSIS — G301 Alzheimer's disease with late onset: Secondary | ICD-10-CM | POA: Diagnosis not present

## 2023-12-05 DIAGNOSIS — G301 Alzheimer's disease with late onset: Secondary | ICD-10-CM | POA: Diagnosis not present

## 2024-01-10 ENCOUNTER — Emergency Department (HOSPITAL_COMMUNITY)
Admission: EM | Admit: 2024-01-10 | Discharge: 2024-01-10 | Disposition: A | Discharge status: Home / Self Care | Attending: Emergency Medicine | Admitting: Emergency Medicine

## 2024-01-10 ENCOUNTER — Other Ambulatory Visit: Payer: Self-pay

## 2024-01-10 ENCOUNTER — Emergency Department (HOSPITAL_COMMUNITY)

## 2024-01-10 DIAGNOSIS — U071 COVID-19: Secondary | ICD-10-CM | POA: Diagnosis not present

## 2024-01-10 DIAGNOSIS — R0989 Other specified symptoms and signs involving the circulatory and respiratory systems: Secondary | ICD-10-CM | POA: Diagnosis not present

## 2024-01-10 DIAGNOSIS — R791 Abnormal coagulation profile: Secondary | ICD-10-CM | POA: Insufficient documentation

## 2024-01-10 DIAGNOSIS — Z0389 Encounter for observation for other suspected diseases and conditions ruled out: Secondary | ICD-10-CM | POA: Diagnosis not present

## 2024-01-10 DIAGNOSIS — Z7401 Bed confinement status: Secondary | ICD-10-CM | POA: Diagnosis not present

## 2024-01-10 DIAGNOSIS — F0390 Unspecified dementia without behavioral disturbance: Secondary | ICD-10-CM | POA: Insufficient documentation

## 2024-01-10 DIAGNOSIS — Z7982 Long term (current) use of aspirin: Secondary | ICD-10-CM | POA: Diagnosis not present

## 2024-01-10 DIAGNOSIS — R509 Fever, unspecified: Secondary | ICD-10-CM | POA: Diagnosis not present

## 2024-01-10 DIAGNOSIS — Z743 Need for continuous supervision: Secondary | ICD-10-CM | POA: Diagnosis not present

## 2024-01-10 DIAGNOSIS — R000 Tachycardia, unspecified: Secondary | ICD-10-CM | POA: Diagnosis not present

## 2024-01-10 DIAGNOSIS — J9811 Atelectasis: Secondary | ICD-10-CM | POA: Diagnosis not present

## 2024-01-10 DIAGNOSIS — I771 Stricture of artery: Secondary | ICD-10-CM | POA: Diagnosis not present

## 2024-01-10 DIAGNOSIS — R61 Generalized hyperhidrosis: Secondary | ICD-10-CM | POA: Diagnosis not present

## 2024-01-10 LAB — CBC WITH DIFFERENTIAL/PLATELET
Abs Immature Granulocytes: 0.02 K/uL (ref 0.00–0.07)
Basophils Absolute: 0 K/uL (ref 0.0–0.1)
Basophils Relative: 1 %
Eosinophils Absolute: 0 K/uL (ref 0.0–0.5)
Eosinophils Relative: 1 %
HCT: 45.3 % (ref 39.0–52.0)
Hemoglobin: 15.2 g/dL (ref 13.0–17.0)
Immature Granulocytes: 0 %
Lymphocytes Relative: 15 %
Lymphs Abs: 0.7 K/uL (ref 0.7–4.0)
MCH: 30.5 pg (ref 26.0–34.0)
MCHC: 33.6 g/dL (ref 30.0–36.0)
MCV: 90.8 fL (ref 80.0–100.0)
Monocytes Absolute: 0.5 K/uL (ref 0.1–1.0)
Monocytes Relative: 9 %
Neutro Abs: 3.5 K/uL (ref 1.7–7.7)
Neutrophils Relative %: 74 %
Platelets: 156 K/uL (ref 150–400)
RBC: 4.99 MIL/uL (ref 4.22–5.81)
RDW: 12.9 % (ref 11.5–15.5)
WBC: 4.8 K/uL (ref 4.0–10.5)
nRBC: 0 % (ref 0.0–0.2)

## 2024-01-10 LAB — URINALYSIS, W/ REFLEX TO CULTURE (INFECTION SUSPECTED)
Bacteria, UA: NONE SEEN
Bilirubin Urine: NEGATIVE
Glucose, UA: NEGATIVE mg/dL
Hgb urine dipstick: NEGATIVE
Ketones, ur: 20 mg/dL — AB
Leukocytes,Ua: NEGATIVE
Nitrite: NEGATIVE
Protein, ur: NEGATIVE mg/dL
Specific Gravity, Urine: 1.016 (ref 1.005–1.030)
pH: 7 (ref 5.0–8.0)

## 2024-01-10 LAB — RESP PANEL BY RT-PCR (RSV, FLU A&B, COVID)  RVPGX2
Influenza A by PCR: NEGATIVE
Influenza B by PCR: NEGATIVE
Resp Syncytial Virus by PCR: NEGATIVE
SARS Coronavirus 2 by RT PCR: POSITIVE — AB

## 2024-01-10 LAB — COMPREHENSIVE METABOLIC PANEL WITH GFR
ALT: 36 U/L (ref 0–44)
AST: 33 U/L (ref 15–41)
Albumin: 3.5 g/dL (ref 3.5–5.0)
Alkaline Phosphatase: 53 U/L (ref 38–126)
Anion gap: 11 (ref 5–15)
BUN: 11 mg/dL (ref 8–23)
CO2: 25 mmol/L (ref 22–32)
Calcium: 9.3 mg/dL (ref 8.9–10.3)
Chloride: 101 mmol/L (ref 98–111)
Creatinine, Ser: 0.8 mg/dL (ref 0.61–1.24)
GFR, Estimated: >60 mL/min (ref >60)
Glucose, Bld: 98 mg/dL (ref 70–99)
Potassium: 4.1 mmol/L (ref 3.5–5.1)
Sodium: 137 mmol/L (ref 135–145)
Total Bilirubin: 0.7 mg/dL (ref 0.0–1.2)
Total Protein: 6.3 g/dL — ABNORMAL LOW (ref 6.5–8.1)

## 2024-01-10 LAB — I-STAT CG4 LACTIC ACID, ED: Lactic Acid, Venous: 1.3 mmol/L (ref 0.5–1.9)

## 2024-01-10 LAB — PROTIME-INR
INR: 1.1 (ref 0.8–1.2)
Prothrombin Time: 14.7 s (ref 11.4–15.2)

## 2024-01-10 MED ORDER — LACTATED RINGERS IV SOLN: INTRAVENOUS | Status: DC

## 2024-01-10 MED ORDER — ACETAMINOPHEN 650 MG RE SUPP
650.0000 mg | Freq: Once | RECTAL | Status: AC
  Administered 2024-01-10: 650 mg via RECTAL
  Filled 2024-01-10: qty 1

## 2024-01-10 NOTE — ED Notes (Signed)
 Patient verbalizes understanding of discharge instructions. Opportunity for questioning and answers were provided. Armband removed by staff, pt discharged from ED. Left with [facility] back to [facility] facility

## 2024-01-10 NOTE — Discharge Instructions (Signed)
 All of pt's labs and x-ray are normal except that he did test positive for covid.  Continue tylenol  500mg  every 6 hours for fever and offer frequent fluids to stay hydrated.  Return for trouble breathing, vomiting or inability to take in orals

## 2024-01-10 NOTE — ED Triage Notes (Signed)
 Called out for AMS to facility. Unsure of last known normal. Staff unable to really give any information regarding why they're calling other than that he's altered. Unable to tell us  a baseline other than hx of alzheimers.    Facility: [facility]   130/70 --> 600 LR 20 L hand

## 2024-01-10 NOTE — ED Notes (Signed)
 [facility] called for transport.

## 2024-01-10 NOTE — ED Provider Notes (Addendum)
 [place] EMERGENCY DEPARTMENT AT [facility] Provider Note   CSN: [id number] Arrival date & time: 01/10/24  1820     Patient presents with: Fever   Jose Fisher is a 68 y.o. male.   Patient is a 68 year old male with a history of Lynch syndrome, memory loss and dementia and chronic pain who lives in a skilled facility and being brought in today due to change in his mental status.  They report he is just not himself.  EMS did not note any focal deficits on their evaluation and there was report of the patient having a cough but no nausea or vomiting.  They also report there is COVID going around the facility where he lives.  Patient does not say anything on exam.  EMS did report when they went to put an IV and he did tell them to get out of there but is not speaking to me.  The history is provided by the EMS personnel, medical records and the nursing home.       Prior to Admission medications   Medication Sig Start Date End Date Taking? Authorizing Provider  aspirin 81 MG chewable tablet Chew 81 mg by mouth 2 (two) times daily.    [provider]  brexpiprazole  (REXULTI ) 2 MG TABS tablet Take 1 tablet (2 mg total) by mouth daily. 06/04/23 05/29/24  Camara, Amadou, MD  citalopram  (CELEXA ) 20 MG tablet Take 1 tablet (20 mg total) by mouth daily. 06/04/23 05/29/24  Camara, Amadou, MD  donepezil  (ARICEPT ) 10 MG tablet Take 1 tablet (10 mg total) by mouth at bedtime. 06/04/23 05/29/24  Gregg Lek, MD  Garlic 1000 MG CAPS Take 1 capsule by mouth daily.    [provider]  Ginger 500 MG CAPS Take 1 capsule by mouth daily.    [provider]  levocetirizine (XYZAL ) 5 MG tablet TAKE 1 TABLET(5 MG) BY MOUTH EVERY EVENING 01/20/21   Duanne Butler DASEN, MD  memantine  (NAMENDA ) 10 MG tablet Take 1 tablet (10 mg total) by mouth 2 (two) times daily. 06/04/23 05/29/24  Gregg Lek, MD  Multiple Vitamins-Minerals (ONE-A-DAY MENS 50+ PO) Take 1 tablet by mouth daily.     [provider]  Turmeric (QC TUMERIC COMPLEX PO) Take 1 tablet by mouth 2 (two) times daily. 1500mg     [provider]    Allergies: Patient has no known allergies.    Review of Systems  Updated Vital Signs BP 138/75   Pulse 68   Temp (!) 100.7 F (38.2 C) (Rectal)   Resp 19   Wt 76 kg   SpO2 94%   BMI 29.68 kg/m   Physical Exam Vitals and nursing note reviewed.  Constitutional:      General: He is not in acute distress.    Appearance: He is well-developed.  HENT:     Head: Normocephalic and atraumatic.     Mouth/Throat:     Mouth: Mucous membranes are dry.  Eyes:     Conjunctiva/sclera: Conjunctivae normal.     Pupils: Pupils are equal, round, and reactive to light.  Cardiovascular:     Rate and Rhythm: Regular rhythm. Tachycardia present.     Pulses: Normal pulses.     Heart sounds: No murmur heard. Pulmonary:     Effort: Pulmonary effort is normal. No respiratory distress.     Breath sounds: Normal breath sounds. No wheezing or rales.  Abdominal:     General: There is no distension.  Palpations: Abdomen is soft.     Tenderness: There is no abdominal tenderness. There is no guarding or rebound.  Musculoskeletal:        General: No tenderness. Normal range of motion.     Cervical back: Normal range of motion and neck supple.     Right lower leg: No edema.     Left lower leg: No edema.  Skin:    General: Skin is warm and dry.     Findings: No erythema or rash.  Neurological:     Mental Status: He is alert.     Comments: Patient is awake and looking around but not speaking or following commands  Psychiatric:        Behavior: Behavior normal.     (all labs ordered are listed, but only abnormal results are displayed) Labs Reviewed  RESP PANEL BY RT-PCR (RSV, FLU A&B, COVID)  [id number] - Abnormal; Notable for the following components:      Result Value   SARS Coronavirus 2 by RT PCR POSITIVE (*)    All other components within normal  limits  COMPREHENSIVE METABOLIC PANEL WITH GFR - Abnormal; Notable for the following components:   Total Protein 6.3 (*)    All other components within normal limits  URINALYSIS, W/ REFLEX TO CULTURE (INFECTION SUSPECTED) - Abnormal; Notable for the following components:   Ketones, ur 20 (*)    All other components within normal limits  CULTURE, BLOOD (ROUTINE X 2)  CULTURE, BLOOD (ROUTINE X 2)  CBC WITH DIFFERENTIAL/PLATELET  PROTIME-INR  I-STAT CG4 LACTIC ACID, ED  I-STAT CG4 LACTIC ACID, ED    EKG: EKG Interpretation Date/Time:  Friday January 10 2024 18:41:01 EDT Ventricular Rate:  72 PR Interval:  150 QRS Duration:  85 QT Interval:  400 QTC Calculation: 438 R Axis:   74  Text Interpretation: Sinus rhythm Low voltage, extremity leads No significant change since last tracing Confirmed by Doretha Folks ([id number]) on 01/10/2024 7:00:59 PM  Radiology: [facility] Chest Port 1 View Result Date: 01/10/2024 CLINICAL DATA:  Questionable sepsis - evaluate for abnormality EXAM: PORTABLE CHEST - 1 VIEW COMPARISON:  Oct 05, 2019 FINDINGS: Lower lung volumes. With streaky bibasilar atelectasis. No cardiomegaly. Tortuous aorta. No acute fracture or destructive lesions. Multilevel thoracic osteophytosis. IMPRESSION: No acute cardiopulmonary abnormality. Electronically Signed   By: Rogelia Myers M.D.   On: 01/10/2024 19:23     Procedures   Medications Ordered in the ED  lactated ringers  infusion ( Intravenous New Bag/Given 01/10/24 2000)  acetaminophen  (TYLENOL ) suppository 650 mg (650 mg Rectal Given 01/10/24 2000)                                    Medical Decision Making Amount and/or Complexity of Data Reviewed Independent Historian: EMS External Data Reviewed: notes. Labs: ordered. Decision-making details documented in ED Course. Radiology: ordered and independent interpretation performed. Decision-making details documented in ED Course. ECG/medicine tests: ordered and independent  interpretation performed. Decision-making details documented in ED Course.  Risk OTC drugs. Prescription drug management.   Pt with multiple medical problems and comorbidities and presenting today with a complaint that caries a high risk for morbidity and mortality.  Here today with concern for possible COVID, sepsis, metabolic encephalopathy and electrolyte abnormalities or AKI, dehydration, other source of infection.  Patient is febrile here to 100.7 but sats are normal.  Does have a coarse sounding cough on exam.  He is in no acute distress at this time and O2 sat is in the mid 90s.  Patient has no evidence of cellulitis or significant wounds.  Undifferentiated sepsis order set was initiated.  10:03 PM I independently interpreted patient's labs and EKG.  UA is within normal limits, COVID test is positive today with negative flu, CMP, CBC and lactate are all within normal limits.  Patient's vital signs remain stable.  I have independently visualized and interpreted pt's images today.  Chest x-ray without acute findings. I attempted to call pt's wife and left a message for no-one called back.  No other contacts available. Pt was able to tolerate po's here and o/w stable.  Feel pt can be d/ced back to the facility.       Final diagnoses:  COVID    ED Discharge Orders     None          Doretha Folks, MD 01/10/24 2203    Doretha Folks, MD 01/10/24 2207

## 2024-01-12 ENCOUNTER — Emergency Department (HOSPITAL_COMMUNITY)

## 2024-01-12 ENCOUNTER — Other Ambulatory Visit: Payer: Self-pay

## 2024-01-12 ENCOUNTER — Observation Stay (HOSPITAL_COMMUNITY)
Admission: EM | Admit: 2024-01-12 | Discharge: 2024-01-15 | Disposition: A | Discharge status: Skilled Nursing Facility | Source: Skilled Nursing Facility | Attending: Internal Medicine | Admitting: Internal Medicine

## 2024-01-12 ENCOUNTER — Encounter (HOSPITAL_COMMUNITY): Payer: Self-pay

## 2024-01-12 DIAGNOSIS — U071 COVID-19: Secondary | ICD-10-CM | POA: Diagnosis not present

## 2024-01-12 DIAGNOSIS — G309 Alzheimer's disease, unspecified: Secondary | ICD-10-CM | POA: Insufficient documentation

## 2024-01-12 DIAGNOSIS — E876 Hypokalemia: Secondary | ICD-10-CM | POA: Diagnosis not present

## 2024-01-12 DIAGNOSIS — F0280 Dementia in other diseases classified elsewhere without behavioral disturbance: Secondary | ICD-10-CM | POA: Insufficient documentation

## 2024-01-12 DIAGNOSIS — G9341 Metabolic encephalopathy: Principal | ICD-10-CM | POA: Insufficient documentation

## 2024-01-12 DIAGNOSIS — E860 Dehydration: Secondary | ICD-10-CM | POA: Insufficient documentation

## 2024-01-12 DIAGNOSIS — Z860101 Personal history of adenomatous and serrated colon polyps: Secondary | ICD-10-CM | POA: Insufficient documentation

## 2024-01-12 DIAGNOSIS — I6782 Cerebral ischemia: Secondary | ICD-10-CM | POA: Diagnosis not present

## 2024-01-12 DIAGNOSIS — R0602 Shortness of breath: Secondary | ICD-10-CM | POA: Diagnosis not present

## 2024-01-12 DIAGNOSIS — Z79899 Other long term (current) drug therapy: Secondary | ICD-10-CM | POA: Diagnosis not present

## 2024-01-12 DIAGNOSIS — Z85038 Personal history of other malignant neoplasm of large intestine: Secondary | ICD-10-CM | POA: Diagnosis not present

## 2024-01-12 DIAGNOSIS — I517 Cardiomegaly: Secondary | ICD-10-CM | POA: Diagnosis not present

## 2024-01-12 DIAGNOSIS — R531 Weakness: Secondary | ICD-10-CM | POA: Diagnosis not present

## 2024-01-12 DIAGNOSIS — G9349 Other encephalopathy: Secondary | ICD-10-CM | POA: Diagnosis not present

## 2024-01-12 DIAGNOSIS — R413 Other amnesia: Secondary | ICD-10-CM | POA: Insufficient documentation

## 2024-01-12 DIAGNOSIS — R0989 Other specified symptoms and signs involving the circulatory and respiratory systems: Secondary | ICD-10-CM | POA: Diagnosis not present

## 2024-01-12 DIAGNOSIS — R4182 Altered mental status, unspecified: Secondary | ICD-10-CM | POA: Diagnosis present

## 2024-01-12 DIAGNOSIS — M6281 Muscle weakness (generalized): Secondary | ICD-10-CM | POA: Insufficient documentation

## 2024-01-12 DIAGNOSIS — Z7982 Long term (current) use of aspirin: Secondary | ICD-10-CM | POA: Diagnosis not present

## 2024-01-12 DIAGNOSIS — G9340 Encephalopathy, unspecified: Secondary | ICD-10-CM | POA: Diagnosis not present

## 2024-01-12 DIAGNOSIS — R059 Cough, unspecified: Secondary | ICD-10-CM | POA: Diagnosis not present

## 2024-01-12 LAB — CBC WITH DIFFERENTIAL/PLATELET
Abs Immature Granulocytes: 0 K/uL (ref 0.00–0.07)
Basophils Absolute: 0 K/uL (ref 0.0–0.1)
Basophils Relative: 1 %
Eosinophils Absolute: 0 K/uL (ref 0.0–0.5)
Eosinophils Relative: 1 %
HCT: 42.1 % (ref 39.0–52.0)
Hemoglobin: 13.9 g/dL (ref 13.0–17.0)
Immature Granulocytes: 0 %
Lymphocytes Relative: 28 %
Lymphs Abs: 1.6 K/uL (ref 0.7–4.0)
MCH: 30.4 pg (ref 26.0–34.0)
MCHC: 33 g/dL (ref 30.0–36.0)
MCV: 92.1 fL (ref 80.0–100.0)
Monocytes Absolute: 0.7 K/uL (ref 0.1–1.0)
Monocytes Relative: 12 %
Neutro Abs: 3.4 K/uL (ref 1.7–7.7)
Neutrophils Relative %: 58 %
Platelets: 128 K/uL — ABNORMAL LOW (ref 150–400)
RBC: 4.57 MIL/uL (ref 4.22–5.81)
RDW: 13.2 % (ref 11.5–15.5)
WBC: 5.7 K/uL (ref 4.0–10.5)
nRBC: 0 % (ref 0.0–0.2)

## 2024-01-12 LAB — URINALYSIS, W/ REFLEX TO CULTURE (INFECTION SUSPECTED)
Bacteria, UA: NONE SEEN
Bilirubin Urine: NEGATIVE
Glucose, UA: NEGATIVE mg/dL
Hgb urine dipstick: NEGATIVE
Ketones, ur: 5 mg/dL — AB
Leukocytes,Ua: NEGATIVE
Nitrite: NEGATIVE
Protein, ur: NEGATIVE mg/dL
Specific Gravity, Urine: 1.023 (ref 1.005–1.030)
pH: 5 (ref 5.0–8.0)

## 2024-01-12 LAB — COMPREHENSIVE METABOLIC PANEL WITH GFR
ALT: 35 U/L (ref 0–44)
AST: 38 U/L (ref 15–41)
Albumin: 3.5 g/dL (ref 3.5–5.0)
Alkaline Phosphatase: 53 U/L (ref 38–126)
Anion gap: 12 (ref 5–15)
BUN: 13 mg/dL (ref 8–23)
CO2: 23 mmol/L (ref 22–32)
Calcium: 8.2 mg/dL — ABNORMAL LOW (ref 8.9–10.3)
Chloride: 105 mmol/L (ref 98–111)
Creatinine, Ser: 0.61 mg/dL (ref 0.61–1.24)
GFR, Estimated: >60 mL/min (ref >60)
Glucose, Bld: 78 mg/dL (ref 70–99)
Potassium: 3.7 mmol/L (ref 3.5–5.1)
Sodium: 140 mmol/L (ref 135–145)
Total Bilirubin: 0.6 mg/dL (ref 0.0–1.2)
Total Protein: 5.9 g/dL — ABNORMAL LOW (ref 6.5–8.1)

## 2024-01-12 LAB — PROTIME-INR
INR: 1.1 (ref 0.8–1.2)
Prothrombin Time: 15.1 s (ref 11.4–15.2)

## 2024-01-12 LAB — AMMONIA: Ammonia: 28 umol/L (ref 9–35)

## 2024-01-12 LAB — TSH: TSH: 2.14 u[IU]/mL (ref 0.350–4.500)

## 2024-01-12 MED ORDER — ENOXAPARIN SODIUM 40 MG/0.4ML IJ SOSY
40.0000 mg | PREFILLED_SYRINGE | INTRAMUSCULAR | Status: DC
  Administered 2024-01-13 – 2024-01-14 (×2): 40 mg via SUBCUTANEOUS
  Filled 2024-01-12 (×3): qty 0.4

## 2024-01-12 MED ORDER — ACETAMINOPHEN 650 MG RE SUPP: 650.0000 mg | Freq: Four times a day (QID) | RECTAL | Status: DC | PRN

## 2024-01-12 MED ORDER — SODIUM CHLORIDE 0.9 % IV BOLUS
1000.0000 mL | Freq: Once | INTRAVENOUS | Status: AC
  Administered 2024-01-12: 1000 mL via INTRAVENOUS

## 2024-01-12 MED ORDER — ONDANSETRON HCL 4 MG/2ML IJ SOLN: 4.0000 mg | Freq: Four times a day (QID) | INTRAMUSCULAR | Status: DC | PRN

## 2024-01-12 MED ORDER — ALBUTEROL SULFATE (2.5 MG/3ML) 0.083% IN NEBU: 2.5000 mg | INHALATION_SOLUTION | RESPIRATORY_TRACT | Status: DC | PRN

## 2024-01-12 MED ORDER — ONDANSETRON HCL 4 MG PO TABS: 4.0000 mg | ORAL_TABLET | Freq: Four times a day (QID) | ORAL | Status: DC | PRN

## 2024-01-12 MED ORDER — SODIUM CHLORIDE 0.9 % IV SOLN: INTRAVENOUS | Status: DC

## 2024-01-12 MED ORDER — TRAZODONE HCL 50 MG PO TABS: 25.0000 mg | ORAL_TABLET | Freq: Every evening | ORAL | Status: DC | PRN

## 2024-01-12 MED ORDER — ACETAMINOPHEN 650 MG RE SUPP
650.0000 mg | Freq: Once | RECTAL | Status: AC
  Administered 2024-01-12: 650 mg via RECTAL
  Filled 2024-01-12: qty 1

## 2024-01-12 MED ORDER — ACETAMINOPHEN 325 MG PO TABS: 650.0000 mg | ORAL_TABLET | Freq: Four times a day (QID) | ORAL | Status: DC | PRN

## 2024-01-12 NOTE — H&P (Signed)
 History and Physical  Jose Fisher FMW:[id number] DOB: [birth year] DOA: 01/12/2024  PCP: Freddrick, No   Chief Complaint: AMS  HPI: Jose Fisher is a 68 y.o. male with medical history significant for Lynch syndrome, memory loss and dementia resident of a local memory care unit being admitted to the hospital with encephalopathy due to COVID infection.  Staff at his facility called EMS 8/29 saying that he is altered, states that he has a history of dementia but are otherwise nonspecific.  Patient was found on the day to have COVID infection, vital signs and lab work were unremarkable, noted that ER provider tried calling the patient's wife was unable to reach her on that day.  Patient was deemed stable and back to his facility.  Once again on 8/31 EMS was called and patient was brought to the ER from his facility, with complaint of lethargy, not eating or drinking, not ambulating.  At baseline he eats on his own and is able to ambulate.  Here workup is once again unremarkable, including head CT.  He was found to have a low-grade fever.  Due to his altered mental status, inability to ambulate, and not eating or drinking anything, hospitalist observation admission was requested.  Review of Systems: Review of systems cannot be completed as I was unable to contact the patient's wife over the phone, patient unable to give any history.  Past Medical History:  Diagnosis Date   Arthritis    Colon cancer (HCC)    Family history of colon cancer    Family history of ovarian cancer    Lynch syndrome    increased risk of cancers of renal pelvis, ureter, stomach, small bowel, bile duct, skin (sebaceous neoplasms), and brain (gliomas   Memory loss    MRI- mild atrophy, microvascular changes, left temporal encephalomalacia (2021) at [facility]   Personal history of colon cancer 05/13/2017   Personal history of colonic polyps 05/13/2017   Wears glasses    Past Surgical History:  Procedure Laterality Date    APPENDECTOMY     COLECTOMY  1985   hemi   COLONOSCOPY     INGUINAL HERNIA REPAIR     bilat   SHOULDER ARTHROSCOPY  2011   RCR-DSC-right stayed [facility]   SHOULDER ARTHROSCOPY WITH ROTATOR CUFF REPAIR AND SUBACROMIAL DECOMPRESSION Left 08/21/2012   Procedure: LEFT SHOULDER ARTHROSCOPY SUBACROMIAL DECOMPRESSION, DISTAL CLAVICLE RESECTION AND REPAIR ROTATOR CUFF;  Surgeon: Lamar LULLA Leonor Mickey., MD;  Location: [facility];  Service: Orthopedics;  Laterality: Left;   TONSILLECTOMY     WRIST GANGLION EXCISION  1978   lt   Social History:  reports that he has never smoked. He has never used smokeless tobacco. He reports current alcohol use. He reports that he does not use drugs.  No Known Allergies  Family History  Problem Relation Age of Onset   Colon cancer Mother 74       says she also had 'intestinal cancer' in records   Ovarian cancer Mother 36   Liver cancer Mother    Heart disease Father    Stroke Father 76   Colon polyps Son 24   Heart disease Paternal Grandfather      Prior to Admission medications   Medication Sig Start Date End Date Taking? Authorizing Provider  aspirin  81 MG chewable tablet Chew 81 mg by mouth 2 (two) times daily.    [provider]  brexpiprazole  (REXULTI ) 2 MG TABS tablet Take 1 tablet (2 mg total)  by mouth daily. 06/04/23 05/29/24  Gregg Lek, MD  citalopram  (CELEXA ) 20 MG tablet Take 1 tablet (20 mg total) by mouth daily. 06/04/23 05/29/24  Camara, Amadou, MD  donepezil  (ARICEPT ) 10 MG tablet Take 1 tablet (10 mg total) by mouth at bedtime. 06/04/23 05/29/24  Gregg Lek, MD  Garlic 1000 MG CAPS Take 1 capsule by mouth daily.    [provider]  Ginger 500 MG CAPS Take 1 capsule by mouth daily.    [provider]  levocetirizine (XYZAL ) 5 MG tablet TAKE 1 TABLET(5 MG) BY MOUTH EVERY EVENING 01/20/21   Duanne Butler DASEN, MD  memantine  (NAMENDA ) 10 MG tablet Take 1 tablet (10 mg total) by mouth 2 (two) times daily.  06/04/23 05/29/24  Gregg Lek, MD  Multiple Vitamins-Minerals (ONE-A-DAY MENS 50+ PO) Take 1 tablet by mouth daily.    [provider]  Turmeric (QC TUMERIC COMPLEX PO) Take 1 tablet by mouth 2 (two) times daily. 1500mg     [provider]    Physical Exam: BP 120/78   Pulse 67   Temp (!) 100.7 F (38.2 C) (Rectal)   Resp 20   Ht 5' 3 (1.6 m)   Wt 76 kg   SpO2 98%   BMI 29.68 kg/m  General: Patient is awake and alert, interactive.  Answers intermittent yes/no questions with some consistency.  Moving all extremities.  He is able to communicate that he is not having any pain or nausea, but does not answer when I ask him if he knows where he is, or what the year is.  Does not answer when I ask him what his name is.  Resting comfortably on room air.  Does not appear to be in any distress. Cardiovascular: RRR, no murmurs or rubs, no peripheral edema  Respiratory: clear to auscultation bilaterally, no wheezes, no crackles  Abdomen: soft, nontender, nondistended, normal bowel tones heard  Skin: dry, no rashes  Musculoskeletal: no joint effusions, normal range of motion  Psychiatric: Flat affect, normal speech  Neurologic: extraocular muscles intact, clear speech, moving all extremities with intact sensorium         Labs on Admission:  Basic Metabolic Panel: Recent Labs  Lab 01/10/24 1926 01/12/24 1357  NA 137 140  K 4.1 3.7  CL 101 105  CO2 25 23  GLUCOSE 98 78  BUN 11 13  CREATININE 0.80 0.61  CALCIUM 9.3 8.2*   Liver Function Tests: Recent Labs  Lab 01/10/24 1926 01/12/24 1357  AST 33 38  ALT 36 35  ALKPHOS 53 53  BILITOT 0.7 0.6  PROT 6.3* 5.9*  ALBUMIN 3.5 3.5   No results for input(s): LIPASE, AMYLASE in the last 168 hours. Recent Labs  Lab 01/12/24 1358  AMMONIA 28   CBC: Recent Labs  Lab 01/10/24 1926 01/12/24 1357  WBC 4.8 5.7  NEUTROABS 3.5 3.4  HGB 15.2 13.9  HCT 45.3 42.1  MCV 90.8 92.1  PLT 156 128*   Cardiac  Enzymes: No results for input(s): CKTOTAL, CKMB, CKMBINDEX, TROPONINI in the last 168 hours. BNP (last 3 results) No results for input(s): BNP in the last 8760 hours.  ProBNP (last 3 results) No results for input(s): PROBNP in the last 8760 hours.  CBG: No results for input(s): GLUCAP in the last 168 hours.  Radiological Exams on Admission: CT Head Wo Contrast Result Date: 01/12/2024 CLINICAL DATA:  Mental status change, unknown cause EXAM: CT HEAD WITHOUT CONTRAST TECHNIQUE: Contiguous axial images were obtained from  the base of the skull through the vertex without intravenous contrast. RADIATION DOSE REDUCTION: This exam was performed according to the departmental dose-optimization program which includes automated exposure control, adjustment of the mA and/or kV according to patient size and/or use of iterative reconstruction technique. COMPARISON:  Brain MRI 09/16/2020 FINDINGS: Brain: No intracranial hemorrhage, mass effect, or midline shift. Cerebral atrophy, with a slight temporal lobe predominance. Mild cerebellar atrophy. No hydrocephalus. The basilar cisterns are patent. Mild periventricular and deep white matter hypodensity typical of chronic small vessel ischemia. No evidence of territorial infarct or acute ischemia. No extra-axial or intracranial fluid collection. Vascular: No hyperdense vessel or unexpected calcification. Skull: No fracture or focal lesion. Sinuses/Orbits: Paranasal sinus mucosal thickening in the left maxillary sinus and ethmoid air cells. Frontal sinuses are hypoplastic. No mastoid effusion. Other: None. IMPRESSION: 1. No acute intracranial abnormality. 2. Chronic atrophy and small vessel ischemia. Electronically Signed   By: Andrea Gasman M.D.   On: 01/12/2024 15:47   DG Chest Portable 1 View Result Date: 01/12/2024 CLINICAL DATA:  Shortness of breath.  Recent COVID. EXAM: PORTABLE CHEST 1 VIEW COMPARISON:  01/10/2024. FINDINGS: The heart is mildly  enlarged and the mediastinal contour is within normal limits. Lung volumes are low. No consolidation, effusion, or pneumothorax is seen. No acute osseous abnormality. IMPRESSION: No active disease. Electronically Signed   By: Leita Birmingham M.D.   On: 01/12/2024 14:37   DG Chest Port 1 View Result Date: 01/10/2024 CLINICAL DATA:  Questionable sepsis - evaluate for abnormality EXAM: PORTABLE CHEST - 1 VIEW COMPARISON:  Oct 05, 2019 FINDINGS: Lower lung volumes. With streaky bibasilar atelectasis. No cardiomegaly. Tortuous aorta. No acute fracture or destructive lesions. Multilevel thoracic osteophytosis. IMPRESSION: No acute cardiopulmonary abnormality. Electronically Signed   By: Rogelia Myers M.D.   On: 01/10/2024 19:23   Assessment/Plan Jose Fisher is a 68 y.o. male with medical history significant for Lynch syndrome, memory loss and dementia resident of a local memory care unit being admitted to the hospital with encephalopathy due to COVID infection.  Encephalopathy-in the setting of COVID infection, on a background of dementia. -Observation admission -Gentle IV fluids  Alzheimer's dementia-will continue home medications once dosing is confirmed  DVT prophylaxis: Lovenox      Code Status: Full Code, will make the patient full code by default as I am not able to get in contact with his wife who is his only listed contact.  Consults called: None  Admission status: Observation  Time spent: 46 minutes  Mollie Rossano CHRISTELLA Gail MD [facility] Pager [phone]  If 7PM-7AM, please contact night-coverage [email or web address] Password [facility]  01/12/2024, 5:54 PM

## 2024-01-12 NOTE — ED Provider Notes (Signed)
 Patient seen after prior EDP.  Hospitalist service made aware of case.  Patient would benefit from admission.   Laurice Maude BROCKS, MD 01/12/24 [phone]

## 2024-01-12 NOTE — ED Provider Notes (Signed)
 [place] EMERGENCY DEPARTMENT AT [facility] Provider Note  CSN: [id number] Arrival date & time: 01/12/24 1258  Chief Complaint(s) Fatigue and Altered Mental Status  HPI Jose Fisher is a 68 y.o. male history of Lynch syndrome, dementia presenting to the emergency department with generalized weakness.  Patient usually ambulatory, was in the ER couple days ago diagnosed with COVID, however apparently patient has been persistently weak and lethargic and nonambulatory.  Patient unable to provide any history other than saying his name is Gee.   Past Medical History Past Medical History:  Diagnosis Date   Arthritis    Colon cancer (HCC)    Family history of colon cancer    Family history of ovarian cancer    Lynch syndrome    increased risk of cancers of renal pelvis, ureter, stomach, small bowel, bile duct, skin (sebaceous neoplasms), and brain (gliomas   Memory loss    MRI- mild atrophy, microvascular changes, left temporal encephalomalacia (2021) at [facility]   Personal history of colon cancer 05/13/2017   Personal history of colonic polyps 05/13/2017   Wears glasses    Patient Active Problem List   Diagnosis Date Noted   Polyp of colon 01/30/2023   Alzheimer disease (HCC) 07/05/2022   Malignant neoplasm of colon, unspecified (HCC) 05/09/2021   Memory loss    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   Genetic testing 06/13/2017   Personal history of colon cancer 05/13/2017   History of colonic polyps 05/13/2017   Family history of colon cancer    Family history of ovarian cancer    Home Medication(s) Prior to Admission medications   Medication Sig Start Date End Date Taking? Authorizing Provider  aspirin  81 MG chewable tablet Chew 81 mg by mouth 2 (two) times daily.    [provider]  brexpiprazole  (REXULTI ) 2 MG TABS tablet Take 1 tablet (2 mg total) by mouth daily. 06/04/23 05/29/24  Camara, Amadou, MD  citalopram  (CELEXA ) 20 MG tablet Take 1 tablet (20 mg total) by  mouth daily. 06/04/23 05/29/24  Camara, Amadou, MD  donepezil  (ARICEPT ) 10 MG tablet Take 1 tablet (10 mg total) by mouth at bedtime. 06/04/23 05/29/24  Gregg Lek, MD  Garlic 1000 MG CAPS Take 1 capsule by mouth daily.    [provider]  Ginger 500 MG CAPS Take 1 capsule by mouth daily.    [provider]  levocetirizine (XYZAL ) 5 MG tablet TAKE 1 TABLET(5 MG) BY MOUTH EVERY EVENING 01/20/21   Duanne Butler DASEN, MD  memantine  (NAMENDA ) 10 MG tablet Take 1 tablet (10 mg total) by mouth 2 (two) times daily. 06/04/23 05/29/24  Gregg Lek, MD  Multiple Vitamins-Minerals (ONE-A-DAY MENS 50+ PO) Take 1 tablet by mouth daily.    [provider]  Turmeric (QC TUMERIC COMPLEX PO) Take 1 tablet by mouth 2 (two) times daily. 1500mg     [provider]  Past Surgical History Past Surgical History:  Procedure Laterality Date   APPENDECTOMY     COLECTOMY  1985   hemi   COLONOSCOPY     INGUINAL HERNIA REPAIR     bilat   SHOULDER ARTHROSCOPY  2011   RCR-DSC-right stayed [facility]   SHOULDER ARTHROSCOPY WITH ROTATOR CUFF REPAIR AND SUBACROMIAL DECOMPRESSION Left 08/21/2012   Procedure: LEFT SHOULDER ARTHROSCOPY SUBACROMIAL DECOMPRESSION, DISTAL CLAVICLE RESECTION AND REPAIR ROTATOR CUFF;  Surgeon: Lamar LULLA Leonor Mickey., MD;  Location: [facility];  Service: Orthopedics;  Laterality: Left;   TONSILLECTOMY     WRIST GANGLION EXCISION  1978   lt   Family History Family History  Problem Relation Age of Onset   Colon cancer Mother 30       says she also had 'intestinal cancer' in records   Ovarian cancer Mother 49   Liver cancer Mother    Heart disease Father    Stroke Father 71   Colon polyps Son 32   Heart disease Paternal Grandfather     Social History Social History   Tobacco Use   Smoking status: Never   Smokeless  tobacco: Never  Substance Use Topics   Alcohol use: Yes    Comment: occ   Drug use: No   Allergies Patient has no known allergies.  Review of Systems Review of Systems  All other systems reviewed and are negative.   Physical Exam Vital Signs  I have reviewed the triage vital signs BP 123/81   Pulse 71   Temp (!) 100.7 F (38.2 C) (Rectal)   Resp 20   Ht 5' 3 (1.6 m)   Wt 76 kg   SpO2 98%   BMI 29.68 kg/m  Physical Exam Vitals and nursing note reviewed.  Constitutional:      General: He is not in acute distress.    Appearance: Normal appearance. He is ill-appearing.  HENT:     Mouth/Throat:     Mouth: Mucous membranes are dry.  Eyes:     Conjunctiva/sclera: Conjunctivae normal.  Cardiovascular:     Rate and Rhythm: Normal rate and regular rhythm.  Pulmonary:     Effort: Pulmonary effort is normal. No respiratory distress.     Breath sounds: Normal breath sounds.  Abdominal:     General: Abdomen is flat.     Palpations: Abdomen is soft.     Tenderness: There is no abdominal tenderness.  Musculoskeletal:     Right lower leg: No edema.     Left lower leg: No edema.  Skin:    General: Skin is warm and dry.     Capillary Refill: Capillary refill takes less than 2 seconds.  Neurological:     Mental Status: He is alert. Mental status is at baseline.     Comments: Oriented to self, follows commands in all 4 extremities, otherwise seems weak in general  Psychiatric:        Mood and Affect: Mood normal.        Behavior: Behavior normal.     ED Results and Treatments Labs (all labs ordered are listed, but only abnormal results are displayed) Labs Reviewed  COMPREHENSIVE METABOLIC PANEL WITH GFR - Abnormal; Notable for the following components:      Result Value   Calcium 8.2 (*)    Total Protein 5.9 (*)    All other components within normal limits  CBC WITH DIFFERENTIAL/PLATELET - Abnormal; Notable for the following components:   Platelets 128 (*)  All  other components within normal limits  AMMONIA  PROTIME-INR  URINALYSIS, W/ REFLEX TO CULTURE (INFECTION SUSPECTED)  TSH                                                                                                                          Radiology DG Chest Portable 1 View Result Date: 01/12/2024 CLINICAL DATA:  Shortness of breath.  Recent COVID. EXAM: PORTABLE CHEST 1 VIEW COMPARISON:  01/10/2024. FINDINGS: The heart is mildly enlarged and the mediastinal contour is within normal limits. Lung volumes are low. No consolidation, effusion, or pneumothorax is seen. No acute osseous abnormality. IMPRESSION: No active disease. Electronically Signed   By: Leita Birmingham M.D.   On: 01/12/2024 14:37    Pertinent labs & imaging results that were available during my care of the patient were reviewed by me and considered in my medical decision making (see MDM for details).  Medications Ordered in ED Medications  sodium chloride  0.9 % bolus 1,000 mL (has no administration in time range)  acetaminophen  (TYLENOL ) suppository 650 mg (has no administration in time range)                                                                                                                                     Procedures Procedures  (including critical care time)  Medical Decision Making / ED Course   MDM:  68 year old presenting to the emergency department with weakness.  Suspect symptoms are likely due to previously diagnosed COVID infection.  Lower concern for other underlying process such as superimposed pneumonia or other infection, intracranial process, toxic or metabolic abnormality.  However, given apparent worsening will check labs to further evaluate.  Will also obtain CT head.  Given his significant weakness anticipate you will likely need to be admitted.  Signed out to Dr. Laurice pending remainder of testing.      Additional history obtained: -Additional history obtained from  ems -External records from outside source obtained and reviewed including: Chart review including previous notes, labs, imaging, consultation notes including provider notes    Lab Tests: -I ordered, reviewed, and interpreted labs.   The pertinent results include:   Labs Reviewed  COMPREHENSIVE METABOLIC PANEL WITH GFR - Abnormal; Notable for the following components:      Result Value   Calcium 8.2 (*)    Total Protein 5.9 (*)    All  other components within normal limits  CBC WITH DIFFERENTIAL/PLATELET - Abnormal; Notable for the following components:   Platelets 128 (*)    All other components within normal limits  AMMONIA  PROTIME-INR  URINALYSIS, W/ REFLEX TO CULTURE (INFECTION SUSPECTED)  TSH    Notable for mild thrombocytopenia   Imaging Studies ordered: I ordered imaging studies including CXR  On my interpretation imaging demonstrates no acute process I independently visualized and interpreted imaging. I agree with the radiologist interpretation   Medicines ordered and prescription drug management: Meds ordered this encounter  Medications   sodium chloride  0.9 % bolus 1,000 mL   acetaminophen  (TYLENOL ) suppository 650 mg    -I have reviewed the patients home medicines and have made adjustments as needed  Social Determinants of Health:  Diagnosis or treatment significantly limited by social determinants of health: obesity   Reevaluation: After the interventions noted above, I reevaluated the patient and found that their symptoms have improved  Co morbidities that complicate the patient evaluation  Past Medical History:  Diagnosis Date   Arthritis    Colon cancer (HCC)    Family history of colon cancer    Family history of ovarian cancer    Lynch syndrome    increased risk of cancers of renal pelvis, ureter, stomach, small bowel, bile duct, skin (sebaceous neoplasms), and brain (gliomas   Memory loss    MRI- mild atrophy, microvascular changes, left  temporal encephalomalacia (2021) at [facility]   Personal history of colon cancer 05/13/2017   Personal history of colonic polyps 05/13/2017   Wears glasses       Dispostion: Disposition decision including need for hospitalization was considered, and patient discharged from emergency department.    Final Clinical Impression(s) / ED Diagnoses Final diagnoses:  Encephalopathy due to COVID-19 virus     This chart was dictated using voice recognition software.  Despite best efforts to proofread,  errors can occur which can change the documentation meaning.    Francesca Elsie CROME, MD 01/12/24 [phone]

## 2024-01-12 NOTE — ED Triage Notes (Signed)
 Pt bib EMS from [facility]. Dx Covid last Friday. Lethargic, A&Ox1, AMS. Ambulatory at baseline. 600 mL NS given.  HR 92  BP 152/90 RR 24 92% RA Productive cough lung sounds clear. CBG 107 Hx Colon Ca

## 2024-01-13 DIAGNOSIS — E860 Dehydration: Secondary | ICD-10-CM | POA: Diagnosis not present

## 2024-01-13 DIAGNOSIS — G309 Alzheimer's disease, unspecified: Secondary | ICD-10-CM

## 2024-01-13 DIAGNOSIS — R413 Other amnesia: Secondary | ICD-10-CM

## 2024-01-13 DIAGNOSIS — U071 COVID-19: Secondary | ICD-10-CM | POA: Diagnosis not present

## 2024-01-13 DIAGNOSIS — F0280 Dementia in other diseases classified elsewhere without behavioral disturbance: Secondary | ICD-10-CM

## 2024-01-13 DIAGNOSIS — G9349 Other encephalopathy: Secondary | ICD-10-CM | POA: Diagnosis not present

## 2024-01-13 LAB — BASIC METABOLIC PANEL WITH GFR
Anion gap: 13 (ref 5–15)
BUN: 10 mg/dL (ref 8–23)
CO2: 25 mmol/L (ref 22–32)
Calcium: 8.6 mg/dL — ABNORMAL LOW (ref 8.9–10.3)
Chloride: 104 mmol/L (ref 98–111)
Creatinine, Ser: 0.62 mg/dL (ref 0.61–1.24)
GFR, Estimated: >60 mL/min (ref >60)
Glucose, Bld: 75 mg/dL (ref 70–99)
Potassium: 3.5 mmol/L (ref 3.5–5.1)
Sodium: 142 mmol/L (ref 135–145)

## 2024-01-13 LAB — HIV ANTIBODY (ROUTINE TESTING W REFLEX): HIV Screen 4th Generation wRfx: NONREACTIVE

## 2024-01-13 LAB — CBC
HCT: 44.6 % (ref 39.0–52.0)
Hemoglobin: 14.6 g/dL (ref 13.0–17.0)
MCH: 30.4 pg (ref 26.0–34.0)
MCHC: 32.7 g/dL (ref 30.0–36.0)
MCV: 92.9 fL (ref 80.0–100.0)
Platelets: 117 K/uL — ABNORMAL LOW (ref 150–400)
RBC: 4.8 MIL/uL (ref 4.22–5.81)
RDW: 13.3 % (ref 11.5–15.5)
WBC: 4.9 K/uL (ref 4.0–10.5)
nRBC: 0 % (ref 0.0–0.2)

## 2024-01-13 MED ORDER — RISPERIDONE 1 MG PO TABS
1.0000 mg | ORAL_TABLET | Freq: Two times a day (BID) | ORAL | Status: DC
  Administered 2024-01-13 – 2024-01-15 (×5): 1 mg via ORAL
  Filled 2024-01-13 (×5): qty 1

## 2024-01-13 MED ORDER — TIZANIDINE HCL 4 MG PO TABS: 4.0000 mg | ORAL_TABLET | Freq: Four times a day (QID) | ORAL | Status: DC | PRN

## 2024-01-13 MED ORDER — DONEPEZIL HCL 10 MG PO TABS
10.0000 mg | ORAL_TABLET | Freq: Every day | ORAL | Status: DC
  Administered 2024-01-13 – 2024-01-14 (×2): 10 mg via ORAL
  Filled 2024-01-13 (×2): qty 1

## 2024-01-13 MED ORDER — SERTRALINE HCL 25 MG PO TABS
25.0000 mg | ORAL_TABLET | Freq: Every day | ORAL | Status: DC
  Administered 2024-01-13 – 2024-01-15 (×3): 25 mg via ORAL
  Filled 2024-01-13 (×3): qty 1

## 2024-01-13 MED ORDER — DIVALPROEX SODIUM 125 MG PO CSDR
125.0000 mg | DELAYED_RELEASE_CAPSULE | Freq: Three times a day (TID) | ORAL | Status: DC
  Administered 2024-01-13 – 2024-01-15 (×7): 125 mg via ORAL
  Filled 2024-01-13 (×7): qty 1

## 2024-01-13 MED ORDER — LORATADINE 10 MG PO TABS
10.0000 mg | ORAL_TABLET | Freq: Every evening | ORAL | Status: DC
  Administered 2024-01-13 – 2024-01-14 (×2): 10 mg via ORAL
  Filled 2024-01-13 (×2): qty 1

## 2024-01-13 MED ORDER — MEMANTINE HCL 10 MG PO TABS
10.0000 mg | ORAL_TABLET | Freq: Two times a day (BID) | ORAL | Status: DC
  Administered 2024-01-13 – 2024-01-15 (×5): 10 mg via ORAL
  Filled 2024-01-13 (×5): qty 1

## 2024-01-13 MED ORDER — ZINC SULFATE 220 (50 ZN) MG PO CAPS
220.0000 mg | ORAL_CAPSULE | Freq: Every day | ORAL | Status: DC
  Administered 2024-01-13 – 2024-01-15 (×3): 220 mg via ORAL
  Filled 2024-01-13 (×3): qty 1

## 2024-01-13 MED ORDER — LORAZEPAM 0.5 MG PO TABS: 0.5000 mg | ORAL_TABLET | Freq: Two times a day (BID) | ORAL | Status: DC | PRN

## 2024-01-13 MED ORDER — VITAMIN C 500 MG PO TABS
1000.0000 mg | ORAL_TABLET | Freq: Every day | ORAL | Status: DC
Start: 2024-01-13 — End: 2024-01-15
  Administered 2024-01-13 – 2024-01-15 (×3): 1000 mg via ORAL
  Filled 2024-01-13 (×3): qty 2

## 2024-01-13 MED ORDER — SODIUM CHLORIDE 0.9 % IV SOLN: INTRAVENOUS | Status: AC

## 2024-01-13 MED ORDER — LORATADINE 10 MG PO TABS
5.0000 mg | ORAL_TABLET | Freq: Every evening | ORAL | Status: DC
  Filled 2024-01-13: qty 0.5

## 2024-01-13 MED ORDER — ASPIRIN 81 MG PO CHEW
81.0000 mg | CHEWABLE_TABLET | Freq: Every day | ORAL | Status: DC
Start: 2024-01-13 — End: 2024-01-15
  Administered 2024-01-13 – 2024-01-15 (×3): 81 mg via ORAL
  Filled 2024-01-13 (×3): qty 1

## 2024-01-13 NOTE — Care Management Obs Status (Signed)
 MEDICARE OBSERVATION STATUS NOTIFICATION   Patient Details  Name: Jose Fisher MRN: [id number] Date of Birth: 28-Jun-1955   Medicare Observation Status Notification Given:  Other (see comment) (Mailed to spouse: [address] [place] [address])    Duwaine GORMAN Aran, LCSW 01/13/2024, 4:01 PM

## 2024-01-13 NOTE — Progress Notes (Signed)
 PROGRESS NOTE    Jose Fisher  FMW:[id number] DOB: February 17, 1956 DOA: 01/12/2024 PCP: Pcp, No    Chief Complaint  Patient presents with   Fatigue   Altered Mental Status    Brief Narrative:  Patient 68 year old gentleman history of leg syndrome, memory loss and dementia, resident of memory care unit who was admitted with acute metabolic encephalopathy secondary to COVID-19 infection.   Assessment & Plan:   Principal Problem:   Encephalopathy due to COVID-19 virus Active Problems:   Memory loss   Alzheimer disease (HCC)   Dehydration   #1 acute metabolic encephalopathy in the setting of COVID-19 infection and baseline dementia -Per admitting physician patient noted at baseline to be able to feed himself and also able to ambulate. -CT head negative for any acute abnormalities. -Urinalysis done nitrite negative, leukocyte negative.   - Chest x-ray with no acute infiltrate noted.  - Patient with sats of 98% on room air. - Continue IV fluids, supportive care.  2.  Dehydration -IV fluids.  3.  Alzheimer's dementia - Resume home regimen of Depakote  sprinkles, Aricept , Namenda , Risperdal .    DVT prophylaxis: Lovenox  Code Status: Full Family Communication: No family at bedside. Disposition: Likely back to memory care unit/facility when medically stable.  Status is: Observation The patient remains OBS appropriate and will d/c before 2 midnights.   Consultants:  None  Procedures:  CT head 01/12/2024 Chest x-ray 01/12/2024  Antimicrobials:  Anti-infectives (From admission, onward)    None         Subjective: Patient sleeping, arousable.  Pleasantly confused.  Not answering questions fully.  Objective: Vitals:   01/12/24 2302 01/13/24 0323 01/13/24 0704 01/13/24 1317  BP: (!) 156/78 (!) 166/96 (!) 171/86 (!) 145/126  Pulse: (!) 55 (!) 59 61 69  Resp: 20 20 20 20   Temp: 98 F (36.7 C) (!) 97.3 F (36.3 C) 97.9 F (36.6 C) 98.7 F (37.1 C)  TempSrc:       SpO2: 96% 96% 97% 98%  Weight:      Height:        Intake/Output Summary (Last 24 hours) at 01/13/2024 1525 Last data filed at 01/13/2024 1444 Gross per 24 hour  Intake 1729.81 ml  Output 2150 ml  Net -420.19 ml   Filed Weights   01/12/24 1316  Weight: 76 kg    Examination:  General exam: Appears calm and comfortable.  Dry mucous membranes Respiratory system: Clear to auscultation anterior lung fields. Respiratory effort normal. Cardiovascular system: S1 & S2 heard, RRR. No JVD, murmurs, rubs, gallops or clicks. No pedal edema. Gastrointestinal system: Abdomen is nondistended, soft and nontender. No organomegaly or masses felt. Normal bowel sounds heard. Central nervous system: Alert.  Moving extremities spontaneously.  No focal neurological deficits. Extremities: Symmetric 5 x 5 power. Skin: No rashes, lesions or ulcers Psychiatry: Judgement and insight appear poor. Mood & affect appropriate.     Data Reviewed: I have personally reviewed following labs and imaging studies  CBC: Recent Labs  Lab 01/10/24 1926 01/12/24 1357 01/13/24 0531  WBC 4.8 5.7 4.9  NEUTROABS 3.5 3.4  --   HGB 15.2 13.9 14.6  HCT 45.3 42.1 44.6  MCV 90.8 92.1 92.9  PLT 156 128* 117*    Basic Metabolic Panel: Recent Labs  Lab 01/10/24 1926 01/12/24 1357 01/13/24 0531  NA 137 140 142  K 4.1 3.7 3.5  CL 101 105 104  CO2 25 23 25   GLUCOSE 98 78 75  BUN 11 13  10  CREATININE 0.80 0.61 0.62  CALCIUM 9.3 8.2* 8.6*    GFR: Estimated Creatinine Clearance: 79.5 mL/min (by C-G formula based on SCr of 0.62 mg/dL).  Liver Function Tests: Recent Labs  Lab 01/10/24 1926 01/12/24 1357  AST 33 38  ALT 36 35  ALKPHOS 53 53  BILITOT 0.7 0.6  PROT 6.3* 5.9*  ALBUMIN 3.5 3.5    CBG: No results for input(s): GLUCAP in the last 168 hours.   Recent Results (from the past 240 hours)  Resp panel by RT-PCR (RSV, Flu A&B, Covid) Anterior Nasal Swab     Status: Abnormal   Collection Time:  01/10/24  6:37 PM   Specimen: Anterior Nasal Swab  Result Value Ref Range Status   SARS Coronavirus 2 by RT PCR POSITIVE (A) NEGATIVE Final   Influenza A by PCR NEGATIVE NEGATIVE Final   Influenza B by PCR NEGATIVE NEGATIVE Final    Comment: (NOTE) The Xpert Xpress SARS-CoV-2/FLU/RSV plus assay is intended as an aid in the diagnosis of influenza from Nasopharyngeal swab specimens and should not be used as a sole basis for treatment. Nasal washings and aspirates are unacceptable for Xpert Xpress SARS-CoV-2/FLU/RSV testing.  Fact Sheet for Patients: BloggerCourse.com  Fact Sheet for Healthcare Providers: SeriousBroker.it  This test is not yet approved or cleared by the [place]  FDA and has been authorized for detection and/or diagnosis of SARS-CoV-2 by FDA under an Emergency Use Authorization (EUA). This EUA will remain in effect (meaning this test can be used) for the duration of the COVID-19 declaration under Section 564(b)(1) of the Act, 21 U.S.C. section 360bbb-3(b)(1), unless the authorization is terminated or revoked.     Resp Syncytial Virus by PCR NEGATIVE NEGATIVE Final    Comment: (NOTE) Fact Sheet for Patients: BloggerCourse.com  Fact Sheet for Healthcare Providers: SeriousBroker.it  This test is not yet approved or cleared by the [place]  FDA and has been authorized for detection and/or diagnosis of SARS-CoV-2 by FDA under an Emergency Use Authorization (EUA). This EUA will remain in effect (meaning this test can be used) for the duration of the COVID-19 declaration under Section 564(b)(1) of the Act, 21 U.S.C. section 360bbb-3(b)(1), unless the authorization is terminated or revoked.  Performed at [facility], [address], [place], [place] [address]   Blood Culture (routine x 2)     Status: None (Preliminary result)   Collection Time:  01/10/24  7:26 PM   Specimen: BLOOD  Result Value Ref Range Status   Specimen Description BLOOD SITE NOT SPECIFIED  Final   Special Requests   Final    BOTTLES DRAWN AEROBIC AND ANAEROBIC Blood Culture adequate volume   Culture  Setup Time   Final    GRAM POSITIVE RODS AEROBIC BOTTLE ONLY CRITICAL RESULT CALLED TO, READ BACK BY AND VERIFIED WITH: PHARMD L POINTDEXTER 01/13/2024 @ 0419 BY AB Performed at [facility], [address] [place], [place] [address]    Culture GRAM POSITIVE RODS  Final   Report Status PENDING  Incomplete  Blood Culture (routine x 2)     Status: None (Preliminary result)   Collection Time: 01/10/24  9:45 PM   Specimen: BLOOD LEFT HAND  Result Value Ref Range Status   Specimen Description BLOOD LEFT HAND  Final   Special Requests   Final    BOTTLES DRAWN AEROBIC AND ANAEROBIC Blood Culture results may not be optimal due to an inadequate volume of blood received in culture bottles  Culture   Final    NO GROWTH 3 DAYS Performed at [facility], [address], [place], [place] [address]    Report Status PENDING  Incomplete         Radiology Studies: CT Head Wo Contrast Result Date: 01/12/2024 CLINICAL DATA:  Mental status change, unknown cause EXAM: CT HEAD WITHOUT CONTRAST TECHNIQUE: Contiguous axial images were obtained from the base of the skull through the vertex without intravenous contrast. RADIATION DOSE REDUCTION: This exam was performed according to the departmental dose-optimization program which includes automated exposure control, adjustment of the mA and/or kV according to patient size and/or use of iterative reconstruction technique. COMPARISON:  Brain MRI 09/16/2020 FINDINGS: Brain: No intracranial hemorrhage, mass effect, or midline shift. Cerebral atrophy, with a slight temporal lobe predominance. Mild cerebellar atrophy. No hydrocephalus. The basilar cisterns are patent. Mild periventricular and deep white matter hypodensity  typical of chronic small vessel ischemia. No evidence of territorial infarct or acute ischemia. No extra-axial or intracranial fluid collection. Vascular: No hyperdense vessel or unexpected calcification. Skull: No fracture or focal lesion. Sinuses/Orbits: Paranasal sinus mucosal thickening in the left maxillary sinus and ethmoid air cells. Frontal sinuses are hypoplastic. No mastoid effusion. Other: None. IMPRESSION: 1. No acute intracranial abnormality. 2. Chronic atrophy and small vessel ischemia. Electronically Signed   By: Andrea Gasman M.D.   On: 01/12/2024 15:47   DG Chest Portable 1 View Result Date: 01/12/2024 CLINICAL DATA:  Shortness of breath.  Recent COVID. EXAM: PORTABLE CHEST 1 VIEW COMPARISON:  01/10/2024. FINDINGS: The heart is mildly enlarged and the mediastinal contour is within normal limits. Lung volumes are low. No consolidation, effusion, or pneumothorax is seen. No acute osseous abnormality. IMPRESSION: No active disease. Electronically Signed   By: Leita Birmingham M.D.   On: 01/12/2024 14:37        Scheduled Meds:  ascorbic acid   1,000 mg Oral Daily   aspirin   81 mg Oral Daily   divalproex   125 mg Oral TID   donepezil   10 mg Oral QHS   enoxaparin  (LOVENOX ) injection  40 mg Subcutaneous Q24H   loratadine   10 mg Oral QPM   memantine   10 mg Oral BID   risperiDONE   1 mg Oral BID   sertraline   25 mg Oral Daily   zinc  sulfate (50mg  elemental zinc )  220 mg Oral Daily   Continuous Infusions:  sodium chloride  100 mL/hr at 01/13/24 0955     LOS: 0 days    Time spent: 40 minutes    Toribio Hummer, MD [facility]   To contact the attending provider between 7A-7P or the covering provider during after hours 7P-7A, please log into the web site [email or web address] and access using universal [place] password for that web site. If you do not have the password, please call the hospital operator.  01/13/2024, 3:25 PM

## 2024-01-13 NOTE — Progress Notes (Signed)
 PHARMACY - PHYSICIAN COMMUNICATION CRITICAL VALUE ALERT - BLOOD CULTURE IDENTIFICATION (BCID)  Jose Fisher is an 68 y.o. male who presented to [facility] on 01/12/2024 with encephalopathy in the setting of COVID infection.  Assessment:  BCID = Gram + Rods in 1 out of 4 bottles.  Currently afebrile, normal WBC.  Likely a contaminant as only in 1 bottle.    Name of physician (or Provider) Contacted: Lynwood Kipper, FNP  Current antibiotics: none  Changes to prescribed antibiotics recommended:  No changes at this time  No results found for this or any previous visit.  Kemp Arvin Fletcher, PharmD 01/13/2024  4:30 AM

## 2024-01-13 NOTE — TOC CM/SW Note (Signed)
 CSW confirmed with UR patient will remain under observation. Patient's spouse, Onaje Warne, not on site to complete MOON as patient is oriented to self only. CSW attempted to call patient's spouse at 3:29pm and 3:57pm to complete the form telephonically, but spouse did not answer and the voicemail is not set up. No other family listed in contact list.

## 2024-01-13 NOTE — Plan of Care (Signed)

## 2024-01-13 NOTE — Plan of Care (Signed)
  Problem: Coping: Goal: Psychosocial and spiritual needs will be supported Outcome: Progressing   Problem: Respiratory: Goal: Will maintain a patent airway Outcome: Progressing   Problem: Nutrition: Goal: Adequate nutrition will be maintained Outcome: Progressing   Problem: Coping: Goal: Level of anxiety will decrease Outcome: Progressing   Problem: Pain Managment: Goal: General experience of comfort will improve and/or be controlled Outcome: Progressing   Problem: Safety: Goal: Ability to remain free from injury will improve Outcome: Progressing   Problem: Skin Integrity: Goal: Risk for impaired skin integrity will decrease Outcome: Progressing

## 2024-01-14 DIAGNOSIS — E860 Dehydration: Secondary | ICD-10-CM | POA: Diagnosis not present

## 2024-01-14 DIAGNOSIS — R413 Other amnesia: Secondary | ICD-10-CM | POA: Diagnosis not present

## 2024-01-14 DIAGNOSIS — G9349 Other encephalopathy: Secondary | ICD-10-CM | POA: Diagnosis not present

## 2024-01-14 DIAGNOSIS — G309 Alzheimer's disease, unspecified: Secondary | ICD-10-CM | POA: Diagnosis not present

## 2024-01-14 DIAGNOSIS — U071 COVID-19: Secondary | ICD-10-CM | POA: Diagnosis not present

## 2024-01-14 LAB — CBC WITH DIFFERENTIAL/PLATELET
Abs Immature Granulocytes: 0.01 K/uL (ref 0.00–0.07)
Basophils Absolute: 0 K/uL (ref 0.0–0.1)
Basophils Relative: 0 %
Eosinophils Absolute: 0.2 K/uL (ref 0.0–0.5)
Eosinophils Relative: 4 %
HCT: 44.2 % (ref 39.0–52.0)
Hemoglobin: 14.7 g/dL (ref 13.0–17.0)
Immature Granulocytes: 0 %
Lymphocytes Relative: 42 %
Lymphs Abs: 2 K/uL (ref 0.7–4.0)
MCH: 30.3 pg (ref 26.0–34.0)
MCHC: 33.3 g/dL (ref 30.0–36.0)
MCV: 91.1 fL (ref 80.0–100.0)
Monocytes Absolute: 0.4 K/uL (ref 0.1–1.0)
Monocytes Relative: 9 %
Neutro Abs: 2.1 K/uL (ref 1.7–7.7)
Neutrophils Relative %: 45 %
Platelets: 130 K/uL — ABNORMAL LOW (ref 150–400)
RBC: 4.85 MIL/uL (ref 4.22–5.81)
RDW: 13.1 % (ref 11.5–15.5)
WBC: 4.8 K/uL (ref 4.0–10.5)
nRBC: 0 % (ref 0.0–0.2)

## 2024-01-14 LAB — RENAL FUNCTION PANEL
Albumin: 3.5 g/dL (ref 3.5–5.0)
Anion gap: 13 (ref 5–15)
BUN: 8 mg/dL (ref 8–23)
CO2: 24 mmol/L (ref 22–32)
Calcium: 8.5 mg/dL — ABNORMAL LOW (ref 8.9–10.3)
Chloride: 105 mmol/L (ref 98–111)
Creatinine, Ser: 0.64 mg/dL (ref 0.61–1.24)
GFR, Estimated: >60 mL/min (ref >60)
Glucose, Bld: 81 mg/dL (ref 70–99)
Phosphorus: 3 mg/dL (ref 2.5–4.6)
Potassium: 3.4 mmol/L — ABNORMAL LOW (ref 3.5–5.1)
Sodium: 141 mmol/L (ref 135–145)

## 2024-01-14 LAB — MAGNESIUM: Magnesium: 2.2 mg/dL (ref 1.7–2.4)

## 2024-01-14 MED ORDER — POTASSIUM CHLORIDE CRYS ER 20 MEQ PO TBCR
40.0000 meq | EXTENDED_RELEASE_TABLET | Freq: Once | ORAL | Status: AC
  Administered 2024-01-14: 40 meq via ORAL
  Filled 2024-01-14: qty 2

## 2024-01-14 MED ORDER — SODIUM CHLORIDE 0.9 % IV SOLN: INTRAVENOUS | Status: DC

## 2024-01-14 NOTE — Progress Notes (Addendum)
 PROGRESS NOTE    Jose Fisher  FMW:[id number] DOB: [birth year] DOA: 01/12/2024 PCP: Pcp, No    Chief Complaint  Patient presents with   Fatigue   Altered Mental Status    Brief Narrative:  Patient 68 year old gentleman history of leg syndrome, memory loss and dementia, resident of memory care unit who was admitted with acute metabolic encephalopathy secondary to COVID-19 infection.   Assessment & Plan:   Principal Problem:   Encephalopathy due to COVID-19 virus Active Problems:   Memory loss   Alzheimer disease (HCC)   Dehydration   #1 acute metabolic encephalopathy in the setting of COVID-19 infection and baseline dementia -Per admitting physician patient noted at baseline to be able to feed himself and also able to ambulate. -CT head negative for any acute abnormalities. -Urinalysis done nitrite negative, leukocyte negative.   - Chest x-ray with no acute infiltrate noted.  - Patient with sats of 97% on room air. - Continue IV fluids, supportive care. - PT/OT.  2.  Dehydration -IV fluids.  3.  Alzheimer's dementia - Continue home regimen of Depakote  sprinkles, Aricept , Namenda , Risperdal .  4.  Hypokalemia -Replete.    DVT prophylaxis: Lovenox  Code Status: Full Family Communication: No family at bedside. Disposition: Likely back to memory care unit/facility when medically stable.  Status is: Observation The patient remains OBS appropriate and will d/c before 2 midnights.   Consultants:  None  Procedures:  CT head 01/12/2024 Chest x-ray 01/12/2024  Antimicrobials:  Anti-infectives (From admission, onward)    None         Subjective: Patient lying in bed sleeping but arousable.  Seems more alert today.  Answers some questions.  Pleasantly confused.   Objective: Vitals:   01/13/24 1726 01/13/24 1900 01/14/24 0950 01/14/24 0953  BP: (!) 145/79 (!) 154/92 (!) 159/122 (!) 156/83  Pulse: (!) 56 (!) 57 79 75  Resp: 18 18    Temp:  98.5 F (36.9  C) 98.2 F (36.8 C)   TempSrc:  Oral Oral   SpO2: 98% 98% 97%   Weight:      Height:        Intake/Output Summary (Last 24 hours) at 01/14/2024 1248 Last data filed at 01/14/2024 1045 Gross per 24 hour  Intake 964.74 ml  Output 3900 ml  Net -2935.26 ml   Filed Weights   01/12/24 1316  Weight: 76 kg    Examination:  General exam: NAD.  Dry mucous membranes. Respiratory system: CTAB anterior lung fields.  No wheezes, no crackles, no rhonchi.  Fair air movement.  Speaking in full sentences.  Cardiovascular system: RRR no murmurs rubs or gallops.  No JVD.  No pitting lower extremity edema.  Gastrointestinal system: Abdomen is soft, nontender, nondistended, positive bowel sounds.  No rebound.  No guarding.  Central nervous system: Alert.  Moving extremities spontaneously.  No focal neurological deficits. Extremities: Symmetric 5 x 5 power. Skin: No rashes, lesions or ulcers Psychiatry: Judgement and insight appear poor. Mood & affect appropriate.     Data Reviewed: I have personally reviewed following labs and imaging studies  CBC: Recent Labs  Lab 01/10/24 1926 01/12/24 1357 01/13/24 0531 01/14/24 0453  WBC 4.8 5.7 4.9 4.8  NEUTROABS 3.5 3.4  --  2.1  HGB 15.2 13.9 14.6 14.7  HCT 45.3 42.1 44.6 44.2  MCV 90.8 92.1 92.9 91.1  PLT 156 128* 117* 130*    Basic Metabolic Panel: Recent Labs  Lab 01/10/24 1926 01/12/24 1357 01/13/24 0531 01/14/24 0453  NA 137 140 142 141  K 4.1 3.7 3.5 3.4*  CL 101 105 104 105  CO2 25 23 25 24   GLUCOSE 98 78 75 81  BUN 11 13 10 8   CREATININE 0.80 0.61 0.62 0.64  CALCIUM 9.3 8.2* 8.6* 8.5*  MG  --   --   --  2.2  PHOS  --   --   --  3.0    GFR: Estimated Creatinine Clearance: 79.5 mL/min (by C-G formula based on SCr of 0.64 mg/dL).  Liver Function Tests: Recent Labs  Lab 01/10/24 1926 01/12/24 1357 01/14/24 0453  AST 33 38  --   ALT 36 35  --   ALKPHOS 53 53  --   BILITOT 0.7 0.6  --   PROT 6.3* 5.9*  --   ALBUMIN  3.5 3.5 3.5    CBG: No results for input(s): GLUCAP in the last 168 hours.   Recent Results (from the past 240 hours)  Resp panel by RT-PCR (RSV, Flu A&B, Covid) Anterior Nasal Swab     Status: Abnormal   Collection Time: 01/10/24  6:37 PM   Specimen: Anterior Nasal Swab  Result Value Ref Range Status   SARS Coronavirus 2 by RT PCR POSITIVE (A) NEGATIVE Final   Influenza A by PCR NEGATIVE NEGATIVE Final   Influenza B by PCR NEGATIVE NEGATIVE Final    Comment: (NOTE) The Xpert Xpress SARS-CoV-2/FLU/RSV plus assay is intended as an aid in the diagnosis of influenza from Nasopharyngeal swab specimens and should not be used as a sole basis for treatment. Nasal washings and aspirates are unacceptable for Xpert Xpress SARS-CoV-2/FLU/RSV testing.  Fact Sheet for Patients: BloggerCourse.com  Fact Sheet for Healthcare Providers: SeriousBroker.it  This test is not yet approved or cleared by the [place]  FDA and has been authorized for detection and/or diagnosis of SARS-CoV-2 by FDA under an Emergency Use Authorization (EUA). This EUA will remain in effect (meaning this test can be used) for the duration of the COVID-19 declaration under Section 564(b)(1) of the Act, 21 U.S.C. section 360bbb-3(b)(1), unless the authorization is terminated or revoked.     Resp Syncytial Virus by PCR NEGATIVE NEGATIVE Final    Comment: (NOTE) Fact Sheet for Patients: BloggerCourse.com  Fact Sheet for Healthcare Providers: SeriousBroker.it  This test is not yet approved or cleared by the [place]  FDA and has been authorized for detection and/or diagnosis of SARS-CoV-2 by FDA under an Emergency Use Authorization (EUA). This EUA will remain in effect (meaning this test can be used) for the duration of the COVID-19 declaration under Section 564(b)(1) of the Act, 21 U.S.C. section  360bbb-3(b)(1), unless the authorization is terminated or revoked.  Performed at [facility], [address] [place], [place] [address]   Blood Culture (routine x 2)     Status: None (Preliminary result)   Collection Time: 01/10/24  7:26 PM   Specimen: BLOOD  Result Value Ref Range Status   Specimen Description BLOOD SITE NOT SPECIFIED  Final   Special Requests   Final    BOTTLES DRAWN AEROBIC AND ANAEROBIC Blood Culture adequate volume   Culture  Setup Time   Final    GRAM POSITIVE RODS AEROBIC BOTTLE ONLY CRITICAL RESULT CALLED TO, READ BACK BY AND VERIFIED WITH: PHARMD L POINTDEXTER 01/13/2024 @ 0419 BY AB    Culture   Final    GRAM POSITIVE RODS TOO YOUNG TO READ Performed at [facility], [address],  [place], [place] [address]    Report Status PENDING  Incomplete  Blood Culture (routine x 2)     Status: None (Preliminary result)   Collection Time: 01/10/24  9:45 PM   Specimen: BLOOD LEFT HAND  Result Value Ref Range Status   Specimen Description BLOOD LEFT HAND  Final   Special Requests   Final    BOTTLES DRAWN AEROBIC AND ANAEROBIC Blood Culture results may not be optimal due to an inadequate volume of blood received in culture bottles   Culture   Final    NO GROWTH 4 DAYS Performed at [facility], [address] [place], [place] [address]    Report Status PENDING  Incomplete         Radiology Studies: CT Head Wo Contrast Result Date: 01/12/2024 CLINICAL DATA:  Mental status change, unknown cause EXAM: CT HEAD WITHOUT CONTRAST TECHNIQUE: Contiguous axial images were obtained from the base of the skull through the vertex without intravenous contrast. RADIATION DOSE REDUCTION: This exam was performed according to the departmental dose-optimization program which includes automated exposure control, adjustment of the mA and/or kV according to patient size and/or use of iterative reconstruction technique. COMPARISON:  Brain MRI 09/16/2020  FINDINGS: Brain: No intracranial hemorrhage, mass effect, or midline shift. Cerebral atrophy, with a slight temporal lobe predominance. Mild cerebellar atrophy. No hydrocephalus. The basilar cisterns are patent. Mild periventricular and deep white matter hypodensity typical of chronic small vessel ischemia. No evidence of territorial infarct or acute ischemia. No extra-axial or intracranial fluid collection. Vascular: No hyperdense vessel or unexpected calcification. Skull: No fracture or focal lesion. Sinuses/Orbits: Paranasal sinus mucosal thickening in the left maxillary sinus and ethmoid air cells. Frontal sinuses are hypoplastic. No mastoid effusion. Other: None. IMPRESSION: 1. No acute intracranial abnormality. 2. Chronic atrophy and small vessel ischemia. Electronically Signed   By: Andrea Gasman M.D.   On: 01/12/2024 15:47   DG Chest Portable 1 View Result Date: 01/12/2024 CLINICAL DATA:  Shortness of breath.  Recent COVID. EXAM: PORTABLE CHEST 1 VIEW COMPARISON:  01/10/2024. FINDINGS: The heart is mildly enlarged and the mediastinal contour is within normal limits. Lung volumes are low. No consolidation, effusion, or pneumothorax is seen. No acute osseous abnormality. IMPRESSION: No active disease. Electronically Signed   By: Leita Birmingham M.D.   On: 01/12/2024 14:37        Scheduled Meds:  ascorbic acid   1,000 mg Oral Daily   aspirin   81 mg Oral Daily   divalproex   125 mg Oral TID   donepezil   10 mg Oral QHS   enoxaparin  (LOVENOX ) injection  40 mg Subcutaneous Q24H   loratadine   10 mg Oral QPM   memantine   10 mg Oral BID   risperiDONE   1 mg Oral BID   sertraline   25 mg Oral Daily   zinc  sulfate (50mg  elemental zinc )  220 mg Oral Daily   Continuous Infusions:     LOS: 0 days    Time spent: 35 minutes    Toribio Hummer, MD [facility]   To contact the attending provider between 7A-7P or the covering provider during after hours 7P-7A, please log into the web  site [email or web address] and access using universal [place] password for that web site. If you do not have the password, please call the hospital operator.  01/14/2024, 12:48 PM

## 2024-01-14 NOTE — Evaluation (Signed)
 Physical Therapy Evaluation Patient Details Name: Jose Fisher MRN: [id number] DOB: 12/24/1955 Today's Date: 01/14/2024  History of Present Illness  68 yr old male brought to the hospital with AMS, fever, and lethargy. He was found to have encephalopathy due to COVID infection. PMH: arthritis, colon CA, Lynch syndrome,dementia, colectomy  Clinical Impression  On eval, pt required Mod A for safe mobility. He walked ~10 feet around the room with 1 HHA. He is at risk for falls when mobilizing. He followed 1 step commands inconsistently. He was minimally verbal throughout session. No family present during session. Recommend SNF unless staff at ALF/memory care and family can provide current level of assist.         If plan is discharge home, recommend the following: A lot of help with walking and/or transfers;A lot of help with bathing/dressing/bathroom;Assist for transportation;Help with stairs or ramp for entrance   Can travel by private vehicle        Equipment Recommendations Rolling walker (2 wheels) (possibly-will continue to assess)  Recommendations for Other Services  OT consult    Functional Status Assessment Patient has had a recent decline in their functional status and demonstrates the ability to make significant improvements in function in a reasonable and predictable amount of time.     Precautions / Restrictions Precautions Precautions: Fall Restrictions Weight Bearing Restrictions Per Provider Order: No      Mobility  Bed Mobility Overal bed mobility: Needs Assistance Bed Mobility: Supine to Sit, Sit to Supine     Supine to sit: Mod assist, HOB elevated, Used rails Sit to supine: Mod assist, Used rails, HOB elevated   General bed mobility comments: Increased time. Assist for trunk and bil LEs. Repeated multimodal cueing required.    Transfers Overall transfer level: Needs assistance Equipment used: Rolling walker (2 wheels) Transfers: Sit to/from Stand Sit  to Stand: Min assist, From elevated surface           General transfer comment: Cues for safety, hand, feet placement. Assist to power up, stabilize, control descent. Fall risk.    Ambulation/Gait Ambulation/Gait assistance: Min assist Gait Distance (Feet): 10 Feet Assistive device: 1 person hand held assist Gait Pattern/deviations: Step-through pattern, Decreased stride length       General Gait Details: High fall risk without UE support. Remained in room. Assist to stabilize pt throughout short distance.  Stairs            Wheelchair Mobility     Tilt Bed    Modified Rankin (Stroke Patients Only)       Balance Overall balance assessment: Needs assistance         Standing balance support: Bilateral upper extremity supported, During functional activity Standing balance-Leahy Scale: Poor                               Pertinent Vitals/Pain Pain Assessment Pain Assessment: Faces Faces Pain Scale: No hurt    Home Living Family/patient expects to be discharged to:: Assisted living                   Additional Comments: Per chart, pt is from [facility]memory care and is ambuatory and able to feed himself. No family present to confirm PLOF    Prior Function Prior Level of Function : Patient poor historian/Family not available  Extremity/Trunk Assessment   Upper Extremity Assessment Upper Extremity Assessment: Defer to OT evaluation    Lower Extremity Assessment Lower Extremity Assessment: Generalized weakness    Cervical / Trunk Assessment Cervical / Trunk Assessment: Normal  Communication   Communication Factors Affecting Communication: Difficulty expressing self    Cognition Arousal: Alert Behavior During Therapy: Flat affect   PT - Cognitive impairments: History of cognitive impairments                         Following commands: Impaired Following commands impaired:  Follows one step commands inconsistently     Cueing Cueing Techniques: Verbal cues, Gestural cues, Tactile cues     General Comments      Exercises     Assessment/Plan    PT Assessment Patient needs continued PT services  PT Problem List Decreased strength;Decreased activity tolerance;Decreased balance;Decreased mobility;Decreased range of motion;Decreased knowledge of use of DME       PT Treatment Interventions DME instruction;Gait training;Therapeutic activities;Therapeutic exercise;Patient/family education;Functional mobility training;Balance training    PT Goals (Current goals can be found in the Care Plan section)  Acute Rehab PT Goals PT Goal Formulation: Patient unable to participate in goal setting Time For Goal Achievement: 01/28/24 Potential to Achieve Goals: Fair    Frequency Min 3X/week     Co-evaluation               AM-PAC PT 6 Clicks Mobility  Outcome Measure Help needed turning from your back to your side while in a flat bed without using bedrails?: A Lot Help needed moving from lying on your back to sitting on the side of a flat bed without using bedrails?: A Lot Help needed moving to and from a bed to a chair (including a wheelchair)?: A Lot Help needed standing up from a chair using your arms (e.g., wheelchair or bedside chair)?: A Lot Help needed to walk in hospital room?: A Lot Help needed climbing 3-5 steps with a railing? : Total 6 Click Score: 11    End of Session Equipment Utilized During Treatment: Gait belt Activity Tolerance: Patient tolerated treatment well Patient left: with call bell/phone within reach;with bed alarm set;in bed        Time: 8848-8780 PT Time Calculation (min) (ACUTE ONLY): 28 min   Charges:   PT Evaluation $PT Eval Low Complexity: 1 Low PT Treatments $Gait Training: 8-22 mins PT General Charges $$ ACUTE PT VISIT: 1 Visit           Dannial SQUIBB, PT Acute Rehabilitation  Office: [phone]

## 2024-01-14 NOTE — Plan of Care (Signed)
  Problem: Respiratory: Goal: Will maintain a patent airway Outcome: Progressing Goal: Complications related to the disease process, condition or treatment will be avoided or minimized Outcome: Progressing   Problem: Clinical Measurements: Goal: Ability to maintain clinical measurements within normal limits will improve Outcome: Progressing Goal: Respiratory complications will improve Outcome: Progressing   Problem: Coping: Goal: Level of anxiety will decrease Outcome: Progressing

## 2024-01-14 NOTE — Plan of Care (Signed)

## 2024-01-14 NOTE — TOC Initial Note (Signed)
 Transition of Care [facility]) - Initial/Assessment Note    Patient Details  Name: Jose Fisher MRN: [id number] Date of Birth: 12-20-1955  Transition of Care [facility]) CM/SW Contact:    Jon ONEIDA Anon, RN Phone Number: 01/14/2024, 10:22 AM  Clinical Narrative:                 Pt is from home with spouse. Needing continued medical workup, not medically ready for DC. PT/OT consulted, awaiting any new recommendations or DC needs. IP Care Management is continuing to follow.    Expected Discharge Plan:  (TBD) Barriers to Discharge: Continued Medical Work up   Patient Goals and CMS Choice Patient states their goals for this hospitalization and ongoing recovery are:: Return home CMS Medicare.gov Compare Post Acute Care list provided to:: Other (Comment Required) (NA) Choice offered to / list presented to : NA [place] ownership interest in [facility].provided to:: Parent NA    Expected Discharge Plan and Services In-house Referral: NA Discharge Planning Services: CM Consult Post Acute Care Choice: NA                   DME Arranged: N/A DME Agency: NA       HH Arranged: NA HH Agency: NA        Prior Living Arrangements/Services   Lives with:: Spouse Patient language and need for interpreter reviewed:: Yes Do you feel safe going back to the place where you live?: Yes      Need for Family Participation in Patient Care: Yes (Comment) Care giver support system in place?: Yes (comment)   Criminal Activity/Legal Involvement Pertinent to Current Situation/Hospitalization: No - Comment as needed  Activities of Daily Living      Permission Sought/Granted Permission sought to share information with : Family Supports Permission granted to share information with : Yes, Verbal Permission Granted  Share Information with NAME: Luiz, Trumpower (Spouse)  [phone]           Emotional Assessment Appearance:: Other (Comment Required (UTA) Attitude/Demeanor/Rapport:  Unable to Assess Affect (typically observed): Unable to Assess Orientation: : Oriented to Self, Oriented to Place Alcohol / Substance Use: Not Applicable Psych Involvement: No (comment)  Admission diagnosis:  Encephalopathy due to COVID-19 virus [U07.1, G93.49] Patient Active Problem List   Diagnosis Date Noted   Dehydration 01/13/2024   Encephalopathy due to COVID-19 virus 01/12/2024   Polyp of colon 01/30/2023   Alzheimer disease (HCC) 07/05/2022   Malignant neoplasm of colon, unspecified (HCC) 05/09/2021   Memory loss    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   Genetic testing 06/13/2017   Personal history of colon cancer 05/13/2017   History of colonic polyps 05/13/2017   Family history of colon cancer    Family history of ovarian cancer    PCP:  Pcp, No Pharmacy:   [id number] - [place], [place] - [address] [place] [address] Phone: [phone] Fax: [phone]  [facility] - [place], [place] - [address] [place] [address] Phone: [phone] Fax: [phone]     Social Drivers of Health (SDOH) Social History: SDOH Screenings   Food Insecurity: Patient Unable To Answer (01/13/2024)  Housing: Low Risk  (09/27/2022)  Transportation Needs: No Transportation Needs (09/27/2022)  Utilities: Not At Risk (09/27/2022)  Alcohol Screen: Low Risk  (09/27/2022)  Depression (PHQ2-9): Low Risk  (01/24/2023)  Financial  Resource Strain: Low Risk  (09/27/2022)  Physical Activity: Insufficiently Active (09/27/2022)  Social Connections: Moderately Integrated (09/27/2022)  Stress: Stress Concern Present (09/27/2022)  Tobacco Use: Low Risk  (01/12/2024)   SDOH Interventions:     Readmission Risk Interventions     No data to display

## 2024-01-14 NOTE — Evaluation (Signed)
 Occupational Therapy Evaluation Patient Details Name: Jose Fisher MRN: [id number] DOB: 03-31-1956 Today's Date: 01/14/2024   History of Present Illness   [name] is a 68 yr old male brought to the hospital with AMS, fever, and lethargy. He was found to have encephalopathy due to COVID infection. PMH: arthritis, colon CA, Lynch syndrome, memory loss, colectomy     Clinical Impressions The pt is currently presenting with the below listed deficits (see OT problem list). As such, his occupational performance is compromised and he requires assistance for self-care management. During the session, he required max assist for supine to sit, max assist for simulated lower body dressing, and min to stand from elevated bed surface using a RW. He required increased time and cues to perform most tasks, with cues needed for cues for initiation and sequencing tasks. OT is unsure if he has some extent of hearing impairment, or if his limitations were primarily due to cognitive processing, as he often did not initiate tasks when verbally cued to do so. He will benefit from further OT services to maximize his safety and independence with self-care tasks. Patient will benefit from continued inpatient follow up therapy, <3 hours/day.      If plan is discharge home, recommend the following:   A lot of help with bathing/dressing/bathroom;A lot of help with walking and/or transfers;Supervision due to cognitive status;Direct supervision/assist for medications management;Direct supervision/assist for financial management     Functional Status Assessment   Patient has had a recent decline in their functional status and demonstrates the ability to make significant improvements in function in a reasonable and predictable amount of time.     Equipment Recommendations   Other (comment) (defer to next level of care)     Recommendations for Other Services         Precautions/Restrictions    Precautions Precautions: Fall Restrictions Weight Bearing Restrictions Per Provider Order: No     Mobility Bed Mobility Overal bed mobility: Needs Assistance Bed Mobility: Supine to Sit     Supine to sit: HOB elevated, Max assist Sit to supine: Min assist   General bed mobility comments: The pt required increased time to perform supine to sit. He required max cues for initiation and sequencing tasks. OT is unsure if he has some extent of hearing impairment, or if limitations were due to cognitive processing. He often did not initiate tasks when verbally cued to do so.    Transfers Overall transfer level: Needs assistance Equipment used: Rolling walker (2 wheels) Transfers: Sit to/from Stand Sit to Stand: From elevated surface, Min assist         Balance     Sitting balance-Leahy Scale: Fair       Standing balance-Leahy Scale: Poor           ADL either performed or assessed with clinical judgement   ADL Overall ADL's : Needs assistance/impaired Eating/Feeding: Minimal assistance;Bed level Eating/Feeding Details (indicate cue type and reason): Once a cup was placed in his hands, he was able to drink from it with SBA. He did this on a couple instances. Grooming: Moderate assistance;Bed level;Cueing for sequencing           Upper Body Dressing : Moderate assistance;Bed level   Lower Body Dressing: Maximal assistance;Sitting/lateral leans       Toileting- Clothing Manipulation and Hygiene: Maximal assistance;Bed level         General ADL Comments: ADL assist levels are largely based on clinical judgement and informal observation.  The pt's ADL performance is at least partially impaired, due to his suspected cognitive deficits, with difficulty noted with regards to command follow, cognitive processing and problem solving.                  Pertinent Vitals/Pain Pain Assessment Pain Assessment: Faces Pain Score: 0-No pain     Extremity/Trunk  Assessment Upper Extremity Assessment Upper Extremity Assessment: Difficult to assess due to impaired cognition   Lower Extremity Assessment Lower Extremity Assessment: Difficult to assess due to impaired cognition       Communication Communication Factors Affecting Communication: Difficulty expressing self   Cognition Arousal: Alert Behavior During Therapy: Flat affect Cognition: History of cognitive impairments             OT - Cognition Comments: The pt did not respond to orientation questions posed.                 Following commands: Impaired Following commands impaired: Follows one step commands inconsistently     Cueing  General Comments   Cueing Techniques: Verbal cues;Gestural cues;Tactile cues              Home Living Family/patient expects to be discharged to:: Unsure    Additional Comments: Per the pt's medical chart, he is a resident at a long-term care facility, memory unit. Another note in the pt's chart states he is from home with his spouse. The pt was unable to provide information regarding his prior level of functioning and living situation, due to his impaired cognition.      Prior Functioning/Environment Prior Level of Function : Patient poor historian/Family not available         OT Problem List: Decreased strength;Decreased activity tolerance;Impaired balance (sitting and/or standing);Decreased cognition;Decreased safety awareness;Decreased knowledge of use of DME or AE;Decreased knowledge of precautions   OT Treatment/Interventions: Self-care/ADL training;Therapeutic exercise;Therapeutic activities;Cognitive remediation/compensation;Energy conservation;DME and/or AE instruction;Patient/family education;Balance training      OT Goals(Current goals can be found in the care plan section)   Acute Rehab OT Goals OT Goal Formulation: Patient unable to participate in goal setting Time For Goal Achievement: 01/28/24 Potential to  Achieve Goals:  (Guarded) ADL Goals Pt Will Perform Eating: with set-up;with supervision;sitting Pt Will Perform Grooming: with set-up;with supervision;sitting Pt Will Perform Upper Body Dressing: with supervision;with set-up;sitting Pt Will Transfer to Toilet: with contact guard assist;ambulating   OT Frequency:  Min 2X/week       AM-PAC OT 6 Clicks Daily Activity     Outcome Measure Help from another person eating meals?: A Little Help from another person taking care of personal grooming?: A Lot Help from another person toileting, which includes using toliet, bedpan, or urinal?: A Lot Help from another person bathing (including washing, rinsing, drying)?: A Lot Help from another person to put on and taking off regular upper body clothing?: A Lot Help from another person to put on and taking off regular lower body clothing?: A Lot 6 Click Score: 13   End of Session Equipment Utilized During Treatment: Gait belt;Rolling walker (2 wheels) Nurse Communication: Mobility status  Activity Tolerance: Other (comment) (fair tolerance) Patient left: in bed;with call bell/phone within reach;with bed alarm set  OT Visit Diagnosis: Unsteadiness on feet (R26.81);Other abnormalities of gait and mobility (R26.89);Muscle weakness (generalized) (M62.81);Other symptoms and signs involving cognitive function                Time: 1120-1135 OT Time Calculation (min): 15 min Charges:  OT General Charges $  OT Visit: 1 Visit OT Evaluation $OT Eval Moderate Complexity: 1 Mod    Delanna JINNY Lesches, OTR/L 01/14/2024, 4:06 PM

## 2024-01-15 DIAGNOSIS — U071 COVID-19: Secondary | ICD-10-CM | POA: Diagnosis not present

## 2024-01-15 DIAGNOSIS — R404 Transient alteration of awareness: Secondary | ICD-10-CM | POA: Diagnosis not present

## 2024-01-15 DIAGNOSIS — Z7401 Bed confinement status: Secondary | ICD-10-CM | POA: Diagnosis not present

## 2024-01-15 DIAGNOSIS — Z743 Need for continuous supervision: Secondary | ICD-10-CM | POA: Diagnosis not present

## 2024-01-15 DIAGNOSIS — G9349 Other encephalopathy: Secondary | ICD-10-CM | POA: Diagnosis not present

## 2024-01-15 LAB — BASIC METABOLIC PANEL WITH GFR
Anion gap: 12 (ref 5–15)
BUN: 7 mg/dL — ABNORMAL LOW (ref 8–23)
CO2: 25 mmol/L (ref 22–32)
Calcium: 8.5 mg/dL — ABNORMAL LOW (ref 8.9–10.3)
Chloride: 104 mmol/L (ref 98–111)
Creatinine, Ser: 0.61 mg/dL (ref 0.61–1.24)
GFR, Estimated: >60 mL/min (ref >60)
Glucose, Bld: 91 mg/dL (ref 70–99)
Potassium: 3.6 mmol/L (ref 3.5–5.1)
Sodium: 142 mmol/L (ref 135–145)

## 2024-01-15 LAB — CBC
HCT: 44.4 % (ref 39.0–52.0)
Hemoglobin: 14.3 g/dL (ref 13.0–17.0)
MCH: 29.1 pg (ref 26.0–34.0)
MCHC: 32.2 g/dL (ref 30.0–36.0)
MCV: 90.4 fL (ref 80.0–100.0)
Platelets: 142 K/uL — ABNORMAL LOW (ref 150–400)
RBC: 4.91 MIL/uL (ref 4.22–5.81)
RDW: 13 % (ref 11.5–15.5)
WBC: 5.4 K/uL (ref 4.0–10.5)
nRBC: 0 % (ref 0.0–0.2)

## 2024-01-15 LAB — CULTURE, BLOOD (ROUTINE X 2): Culture: NO GROWTH

## 2024-01-15 NOTE — Progress Notes (Signed)
 Physical Therapy Treatment Patient Details Name: Jose Fisher MRN: [id number] DOB: [birth year] Today's Date: 01/15/2024   History of Present Illness 68 yr old male brought to the hospital with AMS, fever, and lethargy. He was found to have encephalopathy due to COVID infection. PMH: arthritis, colon CA, Lynch syndrome,dementia, colectomy    PT Comments  Pt soundly sleeping upon my arrival. Able to awaken pt for participation with therapy. Increased assist needed initially for bed mobility. Once sitting up EOB, pt much more participatory. Follows 1 step commands inconsistently. He was able to ambulate ~100 feet with a RW. He tolerated session well. Assisted pt into recliner for lunch-pt able to demonstrate ability to feed himself after setup. No family present during session. May need SNF if he cannot return to ALF/memory care at current level.     If plan is discharge home, recommend the following: A lot of help with walking and/or transfers;A lot of help with bathing/dressing/bathroom;Assistance with cooking/housework;Assist for transportation;Help with stairs or ramp for entrance   Can travel by private vehicle        Equipment Recommendations  Rolling walker (2 wheels) (possibly if he doesn't already have one)    Recommendations for Other Services OT consult     Precautions / Restrictions Precautions Precautions: Fall Restrictions Weight Bearing Restrictions Per Provider Order: No     Mobility  Bed Mobility Overal bed mobility: Needs Assistance Bed Mobility: Supine to Sit     Supine to sit: Mod assist, +2 for physical assistance, +2 for safety/equipment, HOB elevated     General bed mobility comments: Pt sleeping upon arrival-quite drowsy prior to bed mobility so required increased assistance on today. Once EOB able to sit unsupported-CGA    Transfers Overall transfer level: Needs assistance Equipment used: Rolling walker (2 wheels) Transfers: Sit to/from Stand Sit to  Stand: Min assist, +2 safety/equipment, From elevated surface           General transfer comment: Multimodal cues for safety, hand, feet placement. Assist to place hands on RW, power up, stabilize, control descent. Fall risk.    Ambulation/Gait Ambulation/Gait assistance: Min assist, +2 safety/equipment Gait Distance (Feet): 100 Feet Assistive device: Rolling walker (2 wheels) Gait Pattern/deviations: Step-through pattern, Decreased stride length       General Gait Details: Assist to stabilize throughout distance and to maneuver RW when turning/changing direction. Followed closely with recliner for safety-did not have to use it. Tolerated distance well. Fall risk.   Stairs             Wheelchair Mobility     Tilt Bed    Modified Rankin (Stroke Patients Only)       Balance Overall balance assessment: Needs assistance         Standing balance support: Bilateral upper extremity supported, During functional activity, Reliant on assistive device for balance Standing balance-Leahy Scale: Poor                              Communication Communication Factors Affecting Communication: Difficulty expressing self  Cognition Arousal: Alert Behavior During Therapy: Flat affect   PT - Cognitive impairments: History of cognitive impairments                         Following commands: Impaired Following commands impaired: Follows one step commands inconsistently    Cueing Cueing Techniques: Verbal cues, Gestural cues, Tactile cues  Exercises  General Comments        Pertinent Vitals/Pain Pain Assessment Pain Assessment: Faces Faces Pain Scale: No hurt    Home Living                          Prior Function            PT Goals (current goals can now be found in the care plan section) Progress towards PT goals: Progressing toward goals    Frequency    Min 3X/week      PT Plan      Co-evaluation               AM-PAC PT 6 Clicks Mobility   Outcome Measure  Help needed turning from your back to your side while in a flat bed without using bedrails?: A Lot Help needed moving from lying on your back to sitting on the side of a flat bed without using bedrails?: A Lot Help needed moving to and from a bed to a chair (including a wheelchair)?: A Lot Help needed standing up from a chair using your arms (e.g., wheelchair or bedside chair)?: A Lot Help needed to walk in hospital room?: A Lot Help needed climbing 3-5 steps with a railing? : Total 6 Click Score: 11    End of Session Equipment Utilized During Treatment: Gait belt Activity Tolerance: Patient tolerated treatment well Patient left: in chair;with call bell/phone within reach;with chair alarm set   PT Visit Diagnosis: Muscle weakness (generalized) (M62.81);Difficulty in walking, not elsewhere classified (R26.2)     Time: 8872-8851 PT Time Calculation (min) (ACUTE ONLY): 21 min  Charges:    $Gait Training: 8-22 mins PT General Charges $$ ACUTE PT VISIT: 1 Visit                       Dannial SQUIBB, PT Acute Rehabilitation  Office: [phone]

## 2024-01-15 NOTE — Plan of Care (Signed)

## 2024-01-15 NOTE — Discharge Summary (Addendum)
 Physician Discharge Summary  Jose Fisher FMW:[id number] DOB: [birth year] DOA: 01/12/2024  PCP: Pcp, No  Admit date: 01/12/2024 Discharge date: 01/15/2024  Admitted From: Facility Disposition:  Same  Recommendations for Outpatient Follow-up:  Follow up with PCP in 1-2 weeks  Discharge Condition:Stable  CODE STATUS:Full  Diet recommendation: As tolerated regular diet, no added table salt  Brief/Interim Summary: Patient 68 year old gentleman history of leg syndrome, memory loss and dementia, resident of memory care unit who was admitted with acute metabolic encephalopathy secondary to COVID-19 infection - mental status appears to be improving (closer to baseline dementia per report) - given no other acute issues/events overnight - not requiring oxygen he is otherwise stable to return back to prior facility. Would recommend continued masking of patient and interacting staff for the next 5-10 days.  Discharge Diagnoses:  Principal Problem:   Encephalopathy due to COVID-19 virus Active Problems:   Memory loss   Alzheimer disease (HCC)   Dehydration  Acute metabolic encephalopathy in the setting of COVID-19 infection and baseline dementia -Noted at baseline to be able to feed himself and also able to ambulate. -CT head negative for any acute abnormalities. -Urinalysis done nitrite negative, leukocyte negative.   -Chest x-ray with no acute infiltrate noted.  -Remains without hypoxia   Alzheimer's dementia - Continue home regimen of Depakote  sprinkles, Aricept , Namenda , Risperdal  - no changes   Hypokalemia -Repleted - advance diet as tolerated  Discharge Instructions  Discharge Instructions     Diet - low sodium heart healthy   Complete by: As directed    Increase activity slowly   Complete by: As directed       Allergies as of 01/15/2024       Reactions   Bee Pollen Itching        Medication List     TAKE these medications    ascorbic acid  500 MG tablet Commonly  known as: VITAMIN C  Take 1,000 mg by mouth in the morning.   aspirin  81 MG chewable tablet Chew 81 mg by mouth in the morning.   divalproex  125 MG capsule Commonly known as: DEPAKOTE  SPRINKLE Take 125 mg by mouth 3 (three) times daily.   donepezil  10 MG tablet Commonly known as: ARICEPT  Take 1 tablet (10 mg total) by mouth at bedtime.   levocetirizine 5 MG tablet Commonly known as: XYZAL  TAKE 1 TABLET(5 MG) BY MOUTH EVERY EVENING What changed: See the new instructions.   LORazepam  0.5 MG tablet Commonly known as: ATIVAN  Take 0.5 mg by mouth 2 (two) times daily as needed for anxiety.   memantine  10 MG tablet Commonly known as: NAMENDA  Take 1 tablet (10 mg total) by mouth 2 (two) times daily.   ONE-A-DAY MENS 50+ PO Take 1 tablet by mouth in the morning.   risperiDONE  1 MG tablet Commonly known as: RISPERDAL  Take 1 mg by mouth 2 (two) times daily.   sertraline  25 MG tablet Commonly known as: ZOLOFT  Take 25 mg by mouth in the morning.   tiZANidine  4 MG tablet Commonly known as: ZANAFLEX  Take 4 mg by mouth every 6 (six) hours as needed for muscle spasms.   zinc  gluconate 50 MG tablet Take 50 mg by mouth in the morning.        Allergies  Allergen Reactions   Bee Pollen Itching    Consultations: None   Procedures/Studies: CT Head Wo Contrast Result Date: 01/12/2024 CLINICAL DATA:  Mental status change, unknown cause EXAM: CT HEAD WITHOUT CONTRAST TECHNIQUE: Contiguous axial images  were obtained from the base of the skull through the vertex without intravenous contrast. RADIATION DOSE REDUCTION: This exam was performed according to the departmental dose-optimization program which includes automated exposure control, adjustment of the mA and/or kV according to patient size and/or use of iterative reconstruction technique. COMPARISON:  Brain MRI 09/16/2020 FINDINGS: Brain: No intracranial hemorrhage, mass effect, or midline shift. Cerebral atrophy, with a slight  temporal lobe predominance. Mild cerebellar atrophy. No hydrocephalus. The basilar cisterns are patent. Mild periventricular and deep white matter hypodensity typical of chronic small vessel ischemia. No evidence of territorial infarct or acute ischemia. No extra-axial or intracranial fluid collection. Vascular: No hyperdense vessel or unexpected calcification. Skull: No fracture or focal lesion. Sinuses/Orbits: Paranasal sinus mucosal thickening in the left maxillary sinus and ethmoid air cells. Frontal sinuses are hypoplastic. No mastoid effusion. Other: None. IMPRESSION: 1. No acute intracranial abnormality. 2. Chronic atrophy and small vessel ischemia. Electronically Signed   By: Andrea Gasman M.D.   On: 01/12/2024 15:47   DG Chest Portable 1 View Result Date: 01/12/2024 CLINICAL DATA:  Shortness of breath.  Recent COVID. EXAM: PORTABLE CHEST 1 VIEW COMPARISON:  01/10/2024. FINDINGS: The heart is mildly enlarged and the mediastinal contour is within normal limits. Lung volumes are low. No consolidation, effusion, or pneumothorax is seen. No acute osseous abnormality. IMPRESSION: No active disease. Electronically Signed   By: Leita Birmingham M.D.   On: 01/12/2024 14:37   DG Chest Port 1 View Result Date: 01/10/2024 CLINICAL DATA:  Questionable sepsis - evaluate for abnormality EXAM: PORTABLE CHEST - 1 VIEW COMPARISON:  Oct 05, 2019 FINDINGS: Lower lung volumes. With streaky bibasilar atelectasis. No cardiomegaly. Tortuous aorta. No acute fracture or destructive lesions. Multilevel thoracic osteophytosis. IMPRESSION: No acute cardiopulmonary abnormality. Electronically Signed   By: Rogelia Myers M.D.   On: 01/10/2024 19:23     Subjective: No acute issues/events overnight   Discharge Exam: Vitals:   01/14/24 2047 01/15/24 0424  BP: (!) 146/84 (!) 156/83  Pulse: (!) 58 (!) 59  Resp: 18 19  Temp: 98.5 F (36.9 C) 98.9 F (37.2 C)  SpO2: 97% 96%   Vitals:   01/14/24 0950 01/14/24 0953  01/14/24 2047 01/15/24 0424  BP: (!) 159/122 (!) 156/83 (!) 146/84 (!) 156/83  Pulse: 79 75 (!) 58 (!) 59  Resp:   18 19  Temp: 98.2 F (36.8 C)  98.5 F (36.9 C) 98.9 F (37.2 C)  TempSrc: Oral  Oral Oral  SpO2: 97%  97% 96%  Weight:      Height:        General: Pt is alert, awake, not in acute distress Cardiovascular: RRR, S1/S2 +, no rubs, no gallops Respiratory: CTA bilaterally, no wheezing, no rhonchi Abdominal: Soft, NT, ND, bowel sounds + Extremities: no edema, no cyanosis    The results of significant diagnostics from this hospitalization (including imaging, microbiology, ancillary and laboratory) are listed below for reference.     Microbiology: Recent Results (from the past 240 hours)  Resp panel by RT-PCR (RSV, Flu A&B, Covid) Anterior Nasal Swab     Status: Abnormal   Collection Time: 01/10/24  6:37 PM   Specimen: Anterior Nasal Swab  Result Value Ref Range Status   SARS Coronavirus 2 by RT PCR POSITIVE (A) NEGATIVE Final   Influenza A by PCR NEGATIVE NEGATIVE Final   Influenza B by PCR NEGATIVE NEGATIVE Final    Comment: (NOTE) The Xpert Xpress SARS-CoV-2/FLU/RSV plus assay is intended as an aid  in the diagnosis of influenza from Nasopharyngeal swab specimens and should not be used as a sole basis for treatment. Nasal washings and aspirates are unacceptable for Xpert Xpress SARS-CoV-2/FLU/RSV testing.  Fact Sheet for Patients: BloggerCourse.com  Fact Sheet for Healthcare Providers: SeriousBroker.it  This test is not yet approved or cleared by the [place]  FDA and has been authorized for detection and/or diagnosis of SARS-CoV-2 by FDA under an Emergency Use Authorization (EUA). This EUA will remain in effect (meaning this test can be used) for the duration of the COVID-19 declaration under Section 564(b)(1) of the Act, 21 U.S.C. section 360bbb-3(b)(1), unless the authorization is terminated  or revoked.     Resp Syncytial Virus by PCR NEGATIVE NEGATIVE Final    Comment: (NOTE) Fact Sheet for Patients: BloggerCourse.com  Fact Sheet for Healthcare Providers: SeriousBroker.it  This test is not yet approved or cleared by the [place]  FDA and has been authorized for detection and/or diagnosis of SARS-CoV-2 by FDA under an Emergency Use Authorization (EUA). This EUA will remain in effect (meaning this test can be used) for the duration of the COVID-19 declaration under Section 564(b)(1) of the Act, 21 U.S.C. section 360bbb-3(b)(1), unless the authorization is terminated or revoked.  Performed at [facility], [address], [place], [place] [address]   Blood Culture (routine x 2)     Status: None (Preliminary result)   Collection Time: 01/10/24  7:26 PM   Specimen: BLOOD  Result Value Ref Range Status   Specimen Description BLOOD SITE NOT SPECIFIED  Final   Special Requests   Final    BOTTLES DRAWN AEROBIC AND ANAEROBIC Blood Culture adequate volume   Culture  Setup Time   Final    GRAM POSITIVE RODS AEROBIC BOTTLE ONLY CRITICAL RESULT CALLED TO, READ BACK BY AND VERIFIED WITH: PHARMD L POINTDEXTER 01/13/2024 @ 0419 BY AB    Culture   Final    GRAM POSITIVE RODS CULTURE REINCUBATED FOR BETTER GROWTH Performed at [facility], [address], [place], [place] [address]    Report Status PENDING  Incomplete  Blood Culture (routine x 2)     Status: None   Collection Time: 01/10/24  9:45 PM   Specimen: BLOOD LEFT HAND  Result Value Ref Range Status   Specimen Description BLOOD LEFT HAND  Final   Special Requests   Final    BOTTLES DRAWN AEROBIC AND ANAEROBIC Blood Culture results may not be optimal due to an inadequate volume of blood received in culture bottles   Culture   Final    NO GROWTH 5 DAYS Performed at [facility], [address], [place], [place] [address]    Report Status  01/15/2024 FINAL  Final     Labs: BNP (last 3 results) No results for input(s): BNP in the last 8760 hours. Basic Metabolic Panel: Recent Labs  Lab 01/10/24 1926 01/12/24 1357 01/13/24 0531 01/14/24 0453 01/15/24 0527  NA 137 140 142 141 142  K 4.1 3.7 3.5 3.4* 3.6  CL 101 105 104 105 104  CO2 25 23 25 24 25   GLUCOSE 98 78 75 81 91  BUN 11 13 10 8  7*  CREATININE 0.80 0.61 0.62 0.64 0.61  CALCIUM 9.3 8.2* 8.6* 8.5* 8.5*  MG  --   --   --  2.2  --   PHOS  --   --   --  3.0  --    Liver Function Tests: Recent Labs  Lab 01/10/24 1926 01/12/24 1357 01/14/24 0453  AST 33 38  --   ALT 36 35  --   ALKPHOS 53 53  --   BILITOT 0.7 0.6  --   PROT 6.3* 5.9*  --   ALBUMIN 3.5 3.5 3.5   No results for input(s): LIPASE, AMYLASE in the last 168 hours. Recent Labs  Lab 01/12/24 1358  AMMONIA 28   CBC: Recent Labs  Lab 01/10/24 1926 01/12/24 1357 01/13/24 0531 01/14/24 0453 01/15/24 0527  WBC 4.8 5.7 4.9 4.8 5.4  NEUTROABS 3.5 3.4  --  2.1  --   HGB 15.2 13.9 14.6 14.7 14.3  HCT 45.3 42.1 44.6 44.2 44.4  MCV 90.8 92.1 92.9 91.1 90.4  PLT 156 128* 117* 130* 142*   Cardiac Enzymes: No results for input(s): CKTOTAL, CKMB, CKMBINDEX, TROPONINI in the last 168 hours. BNP: Invalid input(s): POCBNP CBG: No results for input(s): GLUCAP in the last 168 hours. D-Dimer No results for input(s): DDIMER in the last 72 hours. Hgb A1c No results for input(s): HGBA1C in the last 72 hours. Lipid Profile No results for input(s): CHOL, HDL, LDLCALC, TRIG, CHOLHDL, LDLDIRECT in the last 72 hours. Thyroid  function studies Recent Labs    01/12/24 1358  TSH 2.140   Anemia work up No results for input(s): VITAMINB12, FOLATE, FERRITIN, TIBC, IRON, RETICCTPCT in the last 72 hours. Urinalysis    Component Value Date/Time   COLORURINE YELLOW 01/12/2024 1521   APPEARANCEUR HAZY (A) 01/12/2024 1521   LABSPEC 1.023 01/12/2024 1521    PHURINE 5.0 01/12/2024 1521   GLUCOSEU NEGATIVE 01/12/2024 1521   HGBUR NEGATIVE 01/12/2024 1521   BILIRUBINUR NEGATIVE 01/12/2024 1521   KETONESUR 5 (A) 01/12/2024 1521   PROTEINUR NEGATIVE 01/12/2024 1521   NITRITE NEGATIVE 01/12/2024 1521   LEUKOCYTESUR NEGATIVE 01/12/2024 1521   Sepsis Labs Recent Labs  Lab 01/12/24 1357 01/13/24 0531 01/14/24 0453 01/15/24 0527  WBC 5.7 4.9 4.8 5.4   Microbiology Recent Results (from the past 240 hours)  Resp panel by RT-PCR (RSV, Flu A&B, Covid) Anterior Nasal Swab     Status: Abnormal   Collection Time: 01/10/24  6:37 PM   Specimen: Anterior Nasal Swab  Result Value Ref Range Status   SARS Coronavirus 2 by RT PCR POSITIVE (A) NEGATIVE Final   Influenza A by PCR NEGATIVE NEGATIVE Final   Influenza B by PCR NEGATIVE NEGATIVE Final    Comment: (NOTE) The Xpert Xpress SARS-CoV-2/FLU/RSV plus assay is intended as an aid in the diagnosis of influenza from Nasopharyngeal swab specimens and should not be used as a sole basis for treatment. Nasal washings and aspirates are unacceptable for Xpert Xpress SARS-CoV-2/FLU/RSV testing.  Fact Sheet for Patients: BloggerCourse.com  Fact Sheet for Healthcare Providers: SeriousBroker.it  This test is not yet approved or cleared by the [place]  FDA and has been authorized for detection and/or diagnosis of SARS-CoV-2 by FDA under an Emergency Use Authorization (EUA). This EUA will remain in effect (meaning this test can be used) for the duration of the COVID-19 declaration under Section 564(b)(1) of the Act, 21 U.S.C. section 360bbb-3(b)(1), unless the authorization is terminated or revoked.     Resp Syncytial Virus by PCR NEGATIVE NEGATIVE Final    Comment: (NOTE) Fact Sheet for Patients: BloggerCourse.com  Fact Sheet for Healthcare Providers: SeriousBroker.it  This test is not yet  approved or cleared by the [place]  FDA and has been authorized for detection and/or diagnosis of SARS-CoV-2 by FDA under an Emergency  Use Authorization (EUA). This EUA will remain in effect (meaning this test can be used) for the duration of the COVID-19 declaration under Section 564(b)(1) of the Act, 21 U.S.C. section 360bbb-3(b)(1), unless the authorization is terminated or revoked.  Performed at [facility], [address] [place], [place] [address]   Blood Culture (routine x 2)     Status: None (Preliminary result)   Collection Time: 01/10/24  7:26 PM   Specimen: BLOOD  Result Value Ref Range Status   Specimen Description BLOOD SITE NOT SPECIFIED  Final   Special Requests   Final    BOTTLES DRAWN AEROBIC AND ANAEROBIC Blood Culture adequate volume   Culture  Setup Time   Final    GRAM POSITIVE RODS AEROBIC BOTTLE ONLY CRITICAL RESULT CALLED TO, READ BACK BY AND VERIFIED WITH: PHARMD L POINTDEXTER 01/13/2024 @ 0419 BY AB    Culture   Final    GRAM POSITIVE RODS CULTURE REINCUBATED FOR BETTER GROWTH Performed at [facility], [address], [place], [place] [address]    Report Status PENDING  Incomplete  Blood Culture (routine x 2)     Status: None   Collection Time: 01/10/24  9:45 PM   Specimen: BLOOD LEFT HAND  Result Value Ref Range Status   Specimen Description BLOOD LEFT HAND  Final   Special Requests   Final    BOTTLES DRAWN AEROBIC AND ANAEROBIC Blood Culture results may not be optimal due to an inadequate volume of blood received in culture bottles   Culture   Final    NO GROWTH 5 DAYS Performed at [facility], [address] [place], [place] [address]    Report Status 01/15/2024 FINAL  Final     Time coordinating discharge: Over 30 minutes  SIGNED:   Elsie JAYSON Montclair, DO [facility] 01/15/2024, 10:11 AM Pager   If 7PM-7AM, please contact night-coverage [email or web address]

## 2024-01-15 NOTE — Progress Notes (Signed)
 Occupational Therapy Treatment Patient Details Name: Jose Fisher MRN: [id number] DOB: 1955-09-01 Today's Date: 01/15/2024   History of present illness 68 yr old male brought to the hospital with AMS, fever, and lethargy. He was found to have encephalopathy due to COVID infection. PMH: arthritis, colon CA, Lynch syndrome,dementia, colectomy   OT comments  The pt was seen for functional strengthening, ADL instruction, and progression of functional activity. He required min assist overall for performing simple self-feeding, face washing, sit to stand using a RW, and for sit to supine. He required increased time and cues for most tasks, due to suspected cognitive processing deficits. Continue OT plan of care. If the staff at his memory care unit cannot manage his functional needs, then short-term SNF rehab is recommended.       If plan is discharge home, recommend the following:  A lot of help with bathing/dressing/bathroom;A lot of help with walking and/or transfers;Supervision due to cognitive status;Direct supervision/assist for medications management;Direct supervision/assist for financial management   Equipment Recommendations  Other (comment) (defer to next level of care)    Recommendations for Other Services      Precautions / Restrictions Precautions Precautions: Fall Restrictions Weight Bearing Restrictions Per Provider Order: No       Mobility Bed Mobility Overal bed mobility: Needs Assistance Bed Mobility: Sit to Supine       Sit to supine: Min assist        Transfers Overall transfer level: Needs assistance Equipment used: Rolling walker (2 wheels) Transfers: Sit to/from Stand Sit to Stand: Min assist                 Balance     Sitting balance-Leahy Scale: Fair       Standing balance-Leahy Scale: Poor          ADL either performed or assessed with clinical judgement   ADL Overall ADL's : Needs assistance/impaired Eating/Feeding: Set  up;Supervision/ safety;Bed level Eating/Feeding Details (indicate cue type and reason): He drank water from a cup in bed. Once the cup was placed in his hand, he brought it to his mouth for drinking. Grooming: Minimal assistance;Bed level Grooming Details (indicate cue type and reason): He required min hand-over-hand assist to initiate and perform face washing at bed level.                                              Communication Communication Factors Affecting Communication: Difficulty expressing self   Cognition Arousal: Alert Behavior During Therapy: Flat affect Cognition: History of cognitive impairments        Following commands: Impaired Following commands impaired: Follows one step commands inconsistently      Cueing   Cueing Techniques: Verbal cues, Gestural cues, Tactile cues             Pertinent Vitals/ Pain       Pain Assessment Pain Assessment: Faces Pain Score: 0-No pain   Frequency  Min 2X/week        Progress Toward Goals  OT Goals(current goals can now be found in the care plan section)     Acute Rehab OT Goals OT Goal Formulation: Patient unable to participate in goal setting Time For Goal Achievement: 01/28/24 Potential to Achieve Goals:  (Guarded)  Plan         AM-PAC OT 6 Clicks Daily Activity  Outcome Measure   Help from another person eating meals?: A Little Help from another person taking care of personal grooming?: A Lot Help from another person toileting, which includes using toliet, bedpan, or urinal?: A Lot Help from another person bathing (including washing, rinsing, drying)?: A Lot Help from another person to put on and taking off regular upper body clothing?: A Lot Help from another person to put on and taking off regular lower body clothing?: A Lot 6 Click Score: 13    End of Session Equipment Utilized During Treatment: Rolling walker (2 wheels)  OT Visit Diagnosis: Unsteadiness on feet  (R26.81);Other abnormalities of gait and mobility (R26.89);Muscle weakness (generalized) (M62.81);Other symptoms and signs involving cognitive function   Activity Tolerance Patient tolerated treatment well   Patient Left in bed;with call bell/phone within reach;with bed alarm set   Nurse Communication Mobility status        Time: 8664-8653 OT Time Calculation (min): 11 min  Charges: OT General Charges $OT Visit: 1 Visit OT Treatments $Therapeutic Activity: 8-22 mins     Delanna JINNY Lesches, OTR/L 01/15/2024, 4:25 PM

## 2024-01-15 NOTE — TOC Progression Note (Signed)
 Transition of Care [facility]) - Progression Note    Patient Details  Name: Jose Fisher MRN: [id number] Date of Birth: 01/13/1956  [facility] CM/SW Contact  NORMAN ASPEN, LCSW Phone Number: 01/15/2024, 11:09 AM  Clinical Narrative:     Have clarified/ confirmed that pt is a resident from memory care at [facility].  Have contacted facility to alert to pt's dc order but the director currently out of facility at appointment.  Facility requesting FL2 and notes to review to determine if can clear for readmission - notes sent.  Will need to confirm he is cleared for readmit or will need to pursue SNF.  Have alerted MD and tx team.  Attempted to reach pt's wife via call and text without success so far.   Expected Discharge Plan:  (TBD) Barriers to Discharge: Continued Medical Work up               Expected Discharge Plan and Services In-house Referral: NA Discharge Planning Services: CM Consult Post Acute Care Choice: NA   Expected Discharge Date: 01/15/24               DME Arranged: N/A DME Agency: NA       HH Arranged: NA HH Agency: NA         Social Drivers of Health (SDOH) Interventions SDOH Screenings   Food Insecurity: Patient Unable To Answer (01/13/2024)  Housing: Low Risk  (09/27/2022)  Transportation Needs: No Transportation Needs (09/27/2022)  Utilities: Not At Risk (09/27/2022)  Alcohol Screen: Low Risk  (09/27/2022)  Depression (PHQ2-9): Low Risk  (01/24/2023)  Financial Resource Strain: Low Risk  (09/27/2022)  Physical Activity: Insufficiently Active (09/27/2022)  Social Connections: Moderately Integrated (09/27/2022)  Stress: Stress Concern Present (09/27/2022)  Tobacco Use: Low Risk  (01/12/2024)    Readmission Risk Interventions     No data to display

## 2024-01-15 NOTE — [place] FL2 (Signed)
   MEDICAID [id number] LEVEL OF CARE FORM     IDENTIFICATION  Patient Name: Jose Fisher Birthdate: 05/21/1955 Sex: male Admission Date (Current Location): 01/12/2024  [facility] and [place] Number:  Producer, television/film/video and Address:  [facility],  [address], [place] [address]      Provider Number: [id number]  Attending Physician Name and Address:  Lue Elsie BROCKS, MD  Relative Name and Phone Number:  wife, Holly Pring @ [phone]    Current Level of Care: Hospital Recommended Level of Care: Memory Care Prior Approval Number:    Date Approved/Denied:   PASRR Number:    Discharge Plan: Other (Comment) (Memory Care)    Current Diagnoses: Patient Active Problem List   Diagnosis Date Noted   Dehydration 01/13/2024   Encephalopathy due to COVID-19 virus 01/12/2024   Polyp of colon 01/30/2023   Alzheimer disease (HCC) 07/05/2022   Malignant neoplasm of colon, unspecified (HCC) 05/09/2021   Memory loss    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   Genetic testing 06/13/2017   Personal history of colon cancer 05/13/2017   History of colonic polyps 05/13/2017   Family history of colon cancer    Family history of ovarian cancer     Orientation RESPIRATION BLADDER Height & Weight     Self  Normal Incontinent Weight: 167 lb 8.8 oz (76 kg) Height:  5' 3 (160 cm)  BEHAVIORAL SYMPTOMS/MOOD NEUROLOGICAL BOWEL NUTRITION STATUS      Incontinent Diet (low sodium)  AMBULATORY STATUS COMMUNICATION OF NEEDS Skin   Supervision Verbally Normal                       Personal Care Assistance Level of Assistance  Bathing, Dressing Bathing Assistance:  (supervision)   Dressing Assistance:  (supervision)     Functional Limitations Info  Sight, Hearing, Speech Sight Info: Adequate Hearing Info: Adequate Speech Info: Adequate    SPECIAL CARE FACTORS FREQUENCY                       Contractures Contractures Info: Not present    Additional  Factors Info  Code Status, Allergies, Psychotropic Code Status Info: Full Allergies Info: bee pollen Psychotropic Info: see MAR         Current Medications (01/15/2024):  This is the current hospital active medication list Current Facility-Administered Medications  Medication Dose Route Frequency Provider Last Rate Last Admin   0.9 %  sodium chloride  infusion   Intravenous Continuous Sebastian Toribio GAILS, MD 100 mL/hr at 01/14/24 1734 New Bag at 01/14/24 1734   acetaminophen  (TYLENOL ) tablet 650 mg  650 mg Oral Q6H PRN Zella Katha HERO, MD       Or   acetaminophen  (TYLENOL ) suppository 650 mg  650 mg Rectal Q6H PRN Zella, Mir M, MD       albuterol  (PROVENTIL ) (2.5 MG/3ML) 0.083% nebulizer solution 2.5 mg  2.5 mg Nebulization Q2H PRN Zella, Mir M, MD       ascorbic acid  (VITAMIN C ) tablet 1,000 mg  1,000 mg Oral Daily Sebastian Toribio GAILS, MD   1,000 mg at 01/15/24 9066   aspirin  chewable tablet 81 mg  81 mg Oral Daily Sebastian Toribio GAILS, MD   81 mg at 01/15/24 9066   divalproex  (DEPAKOTE  SPRINKLE) capsule 125 mg  125 mg Oral TID Sebastian Toribio GAILS, MD   125 mg at 01/15/24 0933   donepezil  (ARICEPT ) tablet 10 mg  10 mg Oral QHS  Sebastian Toribio GAILS, MD   10 mg at 01/14/24 2133   enoxaparin  (LOVENOX ) injection 40 mg  40 mg Subcutaneous Q24H Zella, Mir M, MD   40 mg at 01/14/24 2133   loratadine  (CLARITIN ) tablet 10 mg  10 mg Oral QPM Sebastian Toribio GAILS, MD   10 mg at 01/14/24 1736   LORazepam  (ATIVAN ) tablet 0.5 mg  0.5 mg Oral BID PRN Sebastian Toribio GAILS, MD       memantine  (NAMENDA ) tablet 10 mg  10 mg Oral BID Sebastian Toribio GAILS, MD   10 mg at 01/15/24 9066   ondansetron  (ZOFRAN ) tablet 4 mg  4 mg Oral Q6H PRN Zella Katha HERO, MD       Or   ondansetron  (ZOFRAN ) injection 4 mg  4 mg Intravenous Q6H PRN Zella, Mir M, MD       risperiDONE  (RISPERDAL ) tablet 1 mg  1 mg Oral BID Sebastian Toribio GAILS, MD   1 mg at 01/15/24 9066   sertraline  (ZOLOFT ) tablet 25 mg  25 mg Oral Daily  Sebastian Toribio GAILS, MD   25 mg at 01/15/24 9066   tiZANidine  (ZANAFLEX ) tablet 4 mg  4 mg Oral Q6H PRN Sebastian Toribio GAILS, MD       traZODone  (DESYREL ) tablet 25 mg  25 mg Oral QHS PRN Zella, Mir M, MD       zinc  sulfate (50mg  elemental zinc ) capsule 220 mg  220 mg Oral Daily Sebastian Toribio GAILS, MD   220 mg at 01/15/24 9066     Discharge Medications: Please see discharge summary for a list of discharge medications.  Relevant Imaging Results:  Relevant Lab Results:   Additional Information    Shital Crayton, LCSW

## 2024-01-15 NOTE — TOC Transition Note (Addendum)
 Transition of Care [facility]) - Discharge Note   Patient Details  Name: Jose Fisher MRN: [id number] Date of Birth: 1955-09-20  Transition of Care [facility] CM/SW Contact:  NORMAN ASPEN, LCSW Phone Number: 01/15/2024, 2:03 PM   Clinical Narrative:     Pt has been medically cleared for dc today and has been cleared for readmission to [facility] memory care by facility director, Crystal. Was able to clarify with facility director that pt's wife is not involved with pt's care as she is a resident in another local memory care facility.  Her niece, Camie Emmer [phone]) is the POA and HCPOA now for patient.  Have updated contact/ demographic information to reflect this.  Have alerted Ms. Olack of pt's return to facility. PTAR called at 2:05pm.  RN to call report to [phone] and request med tech.  No further IP CM needs.   Final next level of care: Memory Care Barriers to Discharge: Barriers Resolved   Patient Goals and CMS Choice Patient states their goals for this hospitalization and ongoing recovery are:: return to memory care CMS Medicare.gov Compare Post Acute Care list provided to:: Other (Comment Required) (NA) Choice offered to / list presented to : NA [place] ownership interest in [facility].provided to:: Parent NA    Discharge Placement                Patient to be transferred to facility by: PTAR Name of family member notified: alerted legal guardian, Camie Emmer [phone] Patient and family notified of of transfer: 01/15/24  Discharge Plan and Services Additional resources added to the After Visit Summary for   In-house Referral: NA Discharge Planning Services: CM Consult Post Acute Care Choice: NA          DME Arranged: N/A DME Agency: NA       HH Arranged: NA HH Agency: NA        Social Drivers of Health (SDOH) Interventions SDOH Screenings   Food Insecurity: Patient Unable To Answer (01/13/2024)  Housing: Low Risk  (09/27/2022)   Transportation Needs: No Transportation Needs (09/27/2022)  Utilities: Not At Risk (09/27/2022)  Alcohol Screen: Low Risk  (09/27/2022)  Depression (PHQ2-9): Low Risk  (01/24/2023)  Financial Resource Strain: Low Risk  (09/27/2022)  Physical Activity: Insufficiently Active (09/27/2022)  Social Connections: Moderately Integrated (09/27/2022)  Stress: Stress Concern Present (09/27/2022)  Tobacco Use: Low Risk  (01/12/2024)     Readmission Risk Interventions     No data to display

## 2024-01-15 NOTE — [place] FL2 (Signed)
 [place]  MEDICAID [id number] LEVEL OF CARE FORM     IDENTIFICATION  Patient Name: Jose Fisher Birthdate: [birth year] Sex: male Admission Date (Current Location): 01/12/2024  [facility] and [place] Number:  Producer, television/film/video and Address:  [facility],  [address], [place] [address]      Provider Number: [id number]  Attending Physician Name and Address:  Lue Elsie BROCKS, MD  Relative Name and Phone Number:  wife, Nieko Clarin @ [phone]    Current Level of Care: Hospital Recommended Level of Care: Memory Care Prior Approval Number:    Date Approved/Denied:   PASRR Number:    Discharge Plan: Other (Comment) (Memory Care)    Current Diagnoses: Patient Active Problem List   Diagnosis Date Noted   Dehydration 01/13/2024   Encephalopathy due to COVID-19 virus 01/12/2024   Polyp of colon 01/30/2023   Alzheimer disease (HCC) 07/05/2022   Malignant neoplasm of colon, unspecified (HCC) 05/09/2021   Memory loss    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   Genetic testing 06/13/2017   Personal history of colon cancer 05/13/2017   History of colonic polyps 05/13/2017   Family history of colon cancer    Family history of ovarian cancer     Orientation RESPIRATION BLADDER Height & Weight     Self  Normal Incontinent Weight: 167 lb 8.8 oz (76 kg) Height:  5' 3 (160 cm)  BEHAVIORAL SYMPTOMS/MOOD NEUROLOGICAL BOWEL NUTRITION STATUS      Incontinent Diet (No added table salt)  AMBULATORY STATUS COMMUNICATION OF NEEDS Skin   Limited Assist Verbally Normal                       Personal Care Assistance Level of Assistance  Bathing, Dressing Bathing Assistance:  (supervision)   Dressing Assistance:  (supervision)     Functional Limitations Info  Sight, Hearing, Speech Sight Info: Adequate Hearing Info: Adequate Speech Info: Adequate    SPECIAL CARE FACTORS FREQUENCY  PT (By licensed PT), OT (By licensed OT)     PT Frequency: 2x/wk OT Frequency:  2x/wk            Contractures Contractures Info: Not present    Additional Factors Info  Code Status, Allergies, Psychotropic Code Status Info: Full Allergies Info: bee pollen Psychotropic Info: see MAR         Discharge Medications:  ascorbic acid  500 MG tablet Commonly known as: VITAMIN C  Take 1,000 mg by mouth in the morning.    aspirin  81 MG chewable tablet Chew 81 mg by mouth in the morning.    divalproex  125 MG capsule Commonly known as: DEPAKOTE  SPRINKLE Take 125 mg by mouth 3 (three) times daily.    donepezil  10 MG tablet Commonly known as: ARICEPT  Take 1 tablet (10 mg total) by mouth at bedtime.    levocetirizine 5 MG tablet Commonly known as: XYZAL  TAKE 1 TABLET(5 MG) BY MOUTH EVERY EVENING What changed: See the new instructions.    LORazepam  0.5 MG tablet Commonly known as: ATIVAN  Take 0.5 mg by mouth 2 (two) times daily as needed for anxiety.    memantine  10 MG tablet Commonly known as: NAMENDA  Take 1 tablet (10 mg total) by mouth 2 (two) times daily.    ONE-A-DAY MENS 50+ PO Take 1 tablet by mouth in the morning.    risperiDONE  1 MG tablet Commonly known as: RISPERDAL  Take 1 mg by mouth 2 (two) times daily.    sertraline  25 MG tablet Commonly  known as: ZOLOFT  Take 25 mg by mouth in the morning.    tiZANidine  4 MG tablet Commonly known as: ZANAFLEX  Take 4 mg by mouth every 6 (six) hours as needed for muscle spasms.    zinc  gluconate 50 MG tablet Take 50 mg by mouth in the morning.      Relevant Imaging Results:  Relevant Lab Results:   Additional Information    Caydence Koenig, LCSW

## 2024-01-16 LAB — CULTURE, BLOOD (ROUTINE X 2)
Culture  Setup Time: NO GROWTH
Special Requests: ADEQUATE

## 2024-01-20 DIAGNOSIS — Z0131 Encounter for examination of blood pressure with abnormal findings: Secondary | ICD-10-CM | POA: Diagnosis not present

## 2024-01-20 DIAGNOSIS — G300 Alzheimer's disease with early onset: Secondary | ICD-10-CM | POA: Diagnosis not present

## 2024-01-20 DIAGNOSIS — R627 Adult failure to thrive: Secondary | ICD-10-CM | POA: Diagnosis not present

## 2024-02-03 DIAGNOSIS — L602 Onychogryphosis: Secondary | ICD-10-CM | POA: Diagnosis not present

## 2024-02-03 DIAGNOSIS — G300 Alzheimer's disease with early onset: Secondary | ICD-10-CM | POA: Diagnosis not present

## 2024-02-03 DIAGNOSIS — R627 Adult failure to thrive: Secondary | ICD-10-CM | POA: Diagnosis not present

## 2024-02-03 DIAGNOSIS — R600 Localized edema: Secondary | ICD-10-CM | POA: Diagnosis not present

## 2024-02-03 DIAGNOSIS — Z0130 Encounter for examination of blood pressure without abnormal findings: Secondary | ICD-10-CM | POA: Diagnosis not present

## 2024-02-05 DIAGNOSIS — Z860100 Personal history of colon polyps, unspecified: Secondary | ICD-10-CM | POA: Diagnosis not present

## 2024-02-05 DIAGNOSIS — G9340 Encephalopathy, unspecified: Secondary | ICD-10-CM | POA: Diagnosis not present

## 2024-02-05 DIAGNOSIS — Z993 Dependence on wheelchair: Secondary | ICD-10-CM | POA: Diagnosis not present

## 2024-02-05 DIAGNOSIS — E876 Hypokalemia: Secondary | ICD-10-CM | POA: Diagnosis not present

## 2024-02-05 DIAGNOSIS — E860 Dehydration: Secondary | ICD-10-CM | POA: Diagnosis not present

## 2024-02-05 DIAGNOSIS — G309 Alzheimer's disease, unspecified: Secondary | ICD-10-CM | POA: Diagnosis not present

## 2024-02-05 DIAGNOSIS — Z556 Problems related to health literacy: Secondary | ICD-10-CM | POA: Diagnosis not present

## 2024-02-05 DIAGNOSIS — Z79899 Other long term (current) drug therapy: Secondary | ICD-10-CM | POA: Diagnosis not present

## 2024-02-05 DIAGNOSIS — Z7982 Long term (current) use of aspirin: Secondary | ICD-10-CM | POA: Diagnosis not present

## 2024-02-07 DIAGNOSIS — E860 Dehydration: Secondary | ICD-10-CM | POA: Diagnosis not present

## 2024-02-07 DIAGNOSIS — G309 Alzheimer's disease, unspecified: Secondary | ICD-10-CM | POA: Diagnosis not present

## 2024-02-07 DIAGNOSIS — Z79899 Other long term (current) drug therapy: Secondary | ICD-10-CM | POA: Diagnosis not present

## 2024-02-07 DIAGNOSIS — G9340 Encephalopathy, unspecified: Secondary | ICD-10-CM | POA: Diagnosis not present

## 2024-02-07 DIAGNOSIS — Z993 Dependence on wheelchair: Secondary | ICD-10-CM | POA: Diagnosis not present

## 2024-02-07 DIAGNOSIS — Z556 Problems related to health literacy: Secondary | ICD-10-CM | POA: Diagnosis not present

## 2024-02-07 DIAGNOSIS — E876 Hypokalemia: Secondary | ICD-10-CM | POA: Diagnosis not present

## 2024-02-07 DIAGNOSIS — Z860100 Personal history of colon polyps, unspecified: Secondary | ICD-10-CM | POA: Diagnosis not present

## 2024-02-07 DIAGNOSIS — Z7982 Long term (current) use of aspirin: Secondary | ICD-10-CM | POA: Diagnosis not present

## 2024-02-11 DIAGNOSIS — Z556 Problems related to health literacy: Secondary | ICD-10-CM | POA: Diagnosis not present

## 2024-02-11 DIAGNOSIS — Z7982 Long term (current) use of aspirin: Secondary | ICD-10-CM | POA: Diagnosis not present

## 2024-02-11 DIAGNOSIS — Z860100 Personal history of colon polyps, unspecified: Secondary | ICD-10-CM | POA: Diagnosis not present

## 2024-02-11 DIAGNOSIS — G9340 Encephalopathy, unspecified: Secondary | ICD-10-CM | POA: Diagnosis not present

## 2024-02-11 DIAGNOSIS — Z79899 Other long term (current) drug therapy: Secondary | ICD-10-CM | POA: Diagnosis not present

## 2024-02-11 DIAGNOSIS — E876 Hypokalemia: Secondary | ICD-10-CM | POA: Diagnosis not present

## 2024-02-11 DIAGNOSIS — G309 Alzheimer's disease, unspecified: Secondary | ICD-10-CM | POA: Diagnosis not present

## 2024-02-11 DIAGNOSIS — E860 Dehydration: Secondary | ICD-10-CM | POA: Diagnosis not present

## 2024-02-11 DIAGNOSIS — Z993 Dependence on wheelchair: Secondary | ICD-10-CM | POA: Diagnosis not present

## 2024-02-13 DIAGNOSIS — G9340 Encephalopathy, unspecified: Secondary | ICD-10-CM | POA: Diagnosis not present

## 2024-02-13 DIAGNOSIS — Z993 Dependence on wheelchair: Secondary | ICD-10-CM | POA: Diagnosis not present

## 2024-02-13 DIAGNOSIS — Z7982 Long term (current) use of aspirin: Secondary | ICD-10-CM | POA: Diagnosis not present

## 2024-02-13 DIAGNOSIS — E860 Dehydration: Secondary | ICD-10-CM | POA: Diagnosis not present

## 2024-02-13 DIAGNOSIS — G309 Alzheimer's disease, unspecified: Secondary | ICD-10-CM | POA: Diagnosis not present

## 2024-02-13 DIAGNOSIS — Z556 Problems related to health literacy: Secondary | ICD-10-CM | POA: Diagnosis not present

## 2024-02-13 DIAGNOSIS — E876 Hypokalemia: Secondary | ICD-10-CM | POA: Diagnosis not present

## 2024-02-13 DIAGNOSIS — Z860100 Personal history of colon polyps, unspecified: Secondary | ICD-10-CM | POA: Diagnosis not present

## 2024-02-13 DIAGNOSIS — Z79899 Other long term (current) drug therapy: Secondary | ICD-10-CM | POA: Diagnosis not present

## 2024-02-14 DIAGNOSIS — Z7982 Long term (current) use of aspirin: Secondary | ICD-10-CM | POA: Diagnosis not present

## 2024-02-14 DIAGNOSIS — Z79899 Other long term (current) drug therapy: Secondary | ICD-10-CM | POA: Diagnosis not present

## 2024-02-14 DIAGNOSIS — G309 Alzheimer's disease, unspecified: Secondary | ICD-10-CM | POA: Diagnosis not present

## 2024-02-14 DIAGNOSIS — E876 Hypokalemia: Secondary | ICD-10-CM | POA: Diagnosis not present

## 2024-02-14 DIAGNOSIS — Z860100 Personal history of colon polyps, unspecified: Secondary | ICD-10-CM | POA: Diagnosis not present

## 2024-02-14 DIAGNOSIS — Z556 Problems related to health literacy: Secondary | ICD-10-CM | POA: Diagnosis not present

## 2024-02-14 DIAGNOSIS — Z993 Dependence on wheelchair: Secondary | ICD-10-CM | POA: Diagnosis not present

## 2024-02-14 DIAGNOSIS — E860 Dehydration: Secondary | ICD-10-CM | POA: Diagnosis not present

## 2024-02-14 DIAGNOSIS — G9340 Encephalopathy, unspecified: Secondary | ICD-10-CM | POA: Diagnosis not present

## 2024-02-17 DIAGNOSIS — G9340 Encephalopathy, unspecified: Secondary | ICD-10-CM | POA: Diagnosis not present

## 2024-02-17 DIAGNOSIS — Z0130 Encounter for examination of blood pressure without abnormal findings: Secondary | ICD-10-CM | POA: Diagnosis not present

## 2024-02-17 DIAGNOSIS — Z993 Dependence on wheelchair: Secondary | ICD-10-CM | POA: Diagnosis not present

## 2024-02-17 DIAGNOSIS — Z0001 Encounter for general adult medical examination with abnormal findings: Secondary | ICD-10-CM | POA: Diagnosis not present

## 2024-02-17 DIAGNOSIS — G300 Alzheimer's disease with early onset: Secondary | ICD-10-CM | POA: Diagnosis not present

## 2024-02-17 DIAGNOSIS — E860 Dehydration: Secondary | ICD-10-CM | POA: Diagnosis not present

## 2024-02-17 DIAGNOSIS — Z556 Problems related to health literacy: Secondary | ICD-10-CM | POA: Diagnosis not present

## 2024-02-17 DIAGNOSIS — Z7982 Long term (current) use of aspirin: Secondary | ICD-10-CM | POA: Diagnosis not present

## 2024-02-17 DIAGNOSIS — G309 Alzheimer's disease, unspecified: Secondary | ICD-10-CM | POA: Diagnosis not present

## 2024-02-17 DIAGNOSIS — Z79899 Other long term (current) drug therapy: Secondary | ICD-10-CM | POA: Diagnosis not present

## 2024-02-17 DIAGNOSIS — E876 Hypokalemia: Secondary | ICD-10-CM | POA: Diagnosis not present

## 2024-02-17 DIAGNOSIS — M6281 Muscle weakness (generalized): Secondary | ICD-10-CM | POA: Diagnosis not present

## 2024-02-17 DIAGNOSIS — Z860100 Personal history of colon polyps, unspecified: Secondary | ICD-10-CM | POA: Diagnosis not present

## 2024-02-19 DIAGNOSIS — E876 Hypokalemia: Secondary | ICD-10-CM | POA: Diagnosis not present

## 2024-02-19 DIAGNOSIS — Z860100 Personal history of colon polyps, unspecified: Secondary | ICD-10-CM | POA: Diagnosis not present

## 2024-02-19 DIAGNOSIS — G9340 Encephalopathy, unspecified: Secondary | ICD-10-CM | POA: Diagnosis not present

## 2024-02-19 DIAGNOSIS — E860 Dehydration: Secondary | ICD-10-CM | POA: Diagnosis not present

## 2024-02-19 DIAGNOSIS — Z79899 Other long term (current) drug therapy: Secondary | ICD-10-CM | POA: Diagnosis not present

## 2024-02-19 DIAGNOSIS — Z7982 Long term (current) use of aspirin: Secondary | ICD-10-CM | POA: Diagnosis not present

## 2024-02-19 DIAGNOSIS — Z556 Problems related to health literacy: Secondary | ICD-10-CM | POA: Diagnosis not present

## 2024-02-19 DIAGNOSIS — G309 Alzheimer's disease, unspecified: Secondary | ICD-10-CM | POA: Diagnosis not present

## 2024-02-19 DIAGNOSIS — Z993 Dependence on wheelchair: Secondary | ICD-10-CM | POA: Diagnosis not present

## 2024-02-24 DIAGNOSIS — Z993 Dependence on wheelchair: Secondary | ICD-10-CM | POA: Diagnosis not present

## 2024-02-24 DIAGNOSIS — Z7982 Long term (current) use of aspirin: Secondary | ICD-10-CM | POA: Diagnosis not present

## 2024-02-24 DIAGNOSIS — Z556 Problems related to health literacy: Secondary | ICD-10-CM | POA: Diagnosis not present

## 2024-02-24 DIAGNOSIS — Z79899 Other long term (current) drug therapy: Secondary | ICD-10-CM | POA: Diagnosis not present

## 2024-02-24 DIAGNOSIS — Z860100 Personal history of colon polyps, unspecified: Secondary | ICD-10-CM | POA: Diagnosis not present

## 2024-02-24 DIAGNOSIS — G309 Alzheimer's disease, unspecified: Secondary | ICD-10-CM | POA: Diagnosis not present

## 2024-02-24 DIAGNOSIS — E860 Dehydration: Secondary | ICD-10-CM | POA: Diagnosis not present

## 2024-02-24 DIAGNOSIS — E876 Hypokalemia: Secondary | ICD-10-CM | POA: Diagnosis not present

## 2024-02-24 DIAGNOSIS — G9340 Encephalopathy, unspecified: Secondary | ICD-10-CM | POA: Diagnosis not present

## 2024-02-25 DIAGNOSIS — G309 Alzheimer's disease, unspecified: Secondary | ICD-10-CM | POA: Diagnosis not present

## 2024-02-25 DIAGNOSIS — Z7982 Long term (current) use of aspirin: Secondary | ICD-10-CM | POA: Diagnosis not present

## 2024-02-25 DIAGNOSIS — Z556 Problems related to health literacy: Secondary | ICD-10-CM | POA: Diagnosis not present

## 2024-02-25 DIAGNOSIS — Z993 Dependence on wheelchair: Secondary | ICD-10-CM | POA: Diagnosis not present

## 2024-02-25 DIAGNOSIS — E876 Hypokalemia: Secondary | ICD-10-CM | POA: Diagnosis not present

## 2024-02-25 DIAGNOSIS — E860 Dehydration: Secondary | ICD-10-CM | POA: Diagnosis not present

## 2024-02-25 DIAGNOSIS — Z860100 Personal history of colon polyps, unspecified: Secondary | ICD-10-CM | POA: Diagnosis not present

## 2024-02-25 DIAGNOSIS — G9340 Encephalopathy, unspecified: Secondary | ICD-10-CM | POA: Diagnosis not present

## 2024-02-25 DIAGNOSIS — Z79899 Other long term (current) drug therapy: Secondary | ICD-10-CM | POA: Diagnosis not present

## 2024-02-27 DIAGNOSIS — E876 Hypokalemia: Secondary | ICD-10-CM | POA: Diagnosis not present

## 2024-02-27 DIAGNOSIS — Z556 Problems related to health literacy: Secondary | ICD-10-CM | POA: Diagnosis not present

## 2024-02-27 DIAGNOSIS — G309 Alzheimer's disease, unspecified: Secondary | ICD-10-CM | POA: Diagnosis not present

## 2024-02-27 DIAGNOSIS — E860 Dehydration: Secondary | ICD-10-CM | POA: Diagnosis not present

## 2024-02-27 DIAGNOSIS — Z79899 Other long term (current) drug therapy: Secondary | ICD-10-CM | POA: Diagnosis not present

## 2024-02-27 DIAGNOSIS — G9340 Encephalopathy, unspecified: Secondary | ICD-10-CM | POA: Diagnosis not present

## 2024-02-27 DIAGNOSIS — Z993 Dependence on wheelchair: Secondary | ICD-10-CM | POA: Diagnosis not present

## 2024-02-27 DIAGNOSIS — Z7982 Long term (current) use of aspirin: Secondary | ICD-10-CM | POA: Diagnosis not present

## 2024-02-27 DIAGNOSIS — Z860100 Personal history of colon polyps, unspecified: Secondary | ICD-10-CM | POA: Diagnosis not present
